# Patient Record
Sex: Female | Born: 1975 | Hispanic: Yes | Marital: Single | Smoking: Never smoker
Health system: Southern US, Community
[De-identification: ages and names within clinical notes are randomized; demographics above are authoritative.]

## PROBLEM LIST (undated history)

## (undated) DIAGNOSIS — M542 Cervicalgia: Secondary | ICD-10-CM

## (undated) DIAGNOSIS — E785 Hyperlipidemia, unspecified: Secondary | ICD-10-CM

## (undated) DIAGNOSIS — F329 Major depressive disorder, single episode, unspecified: Secondary | ICD-10-CM

## (undated) DIAGNOSIS — F32A Depression, unspecified: Secondary | ICD-10-CM

## (undated) DIAGNOSIS — R87619 Unspecified abnormal cytological findings in specimens from cervix uteri: Secondary | ICD-10-CM

## (undated) HISTORY — DX: Major depressive disorder, single episode, unspecified: F32.9

## (undated) HISTORY — DX: Unspecified abnormal cytological findings in specimens from cervix uteri: R87.619

## (undated) HISTORY — PX: COLPOSCOPY: SHX161

## (undated) HISTORY — DX: Cervicalgia: M54.2

## (undated) HISTORY — PX: TUBAL LIGATION: SHX77

## (undated) HISTORY — DX: Depression, unspecified: F32.A

## (undated) HISTORY — DX: Hyperlipidemia, unspecified: E78.5

---

## 2014-07-06 DIAGNOSIS — M542 Cervicalgia: Secondary | ICD-10-CM

## 2014-07-06 HISTORY — DX: Cervicalgia: M54.2

## 2014-09-30 ENCOUNTER — Emergency Department (HOSPITAL_COMMUNITY): Payer: Self-pay

## 2014-09-30 ENCOUNTER — Encounter (HOSPITAL_COMMUNITY): Payer: Self-pay | Admitting: Emergency Medicine

## 2014-09-30 ENCOUNTER — Emergency Department (HOSPITAL_COMMUNITY)
Admission: EM | Admit: 2014-09-30 | Discharge: 2014-09-30 | Disposition: A | Discharge status: Home / Self Care | Payer: Self-pay | Attending: Emergency Medicine | Admitting: Emergency Medicine

## 2014-09-30 DIAGNOSIS — M5412 Radiculopathy, cervical region: Secondary | ICD-10-CM | POA: Insufficient documentation

## 2014-09-30 DIAGNOSIS — M542 Cervicalgia: Secondary | ICD-10-CM

## 2014-09-30 MED ORDER — CYCLOBENZAPRINE HCL 10 MG PO TABS: 10.0000 mg | ORAL_TABLET | Freq: Three times a day (TID) | ORAL | Status: DC | PRN

## 2014-09-30 MED ORDER — KETOROLAC TROMETHAMINE 60 MG/2ML IM SOLN
60.0000 mg | Freq: Once | INTRAMUSCULAR | Status: AC
  Administered 2014-09-30: 60 mg via INTRAMUSCULAR
  Filled 2014-09-30: qty 2

## 2014-09-30 MED ORDER — NAPROXEN 500 MG PO TABS: 500.0000 mg | ORAL_TABLET | Freq: Two times a day (BID) | ORAL | Status: DC

## 2014-09-30 MED ORDER — DIAZEPAM 5 MG PO TABS
5.0000 mg | ORAL_TABLET | Freq: Once | ORAL | Status: AC
  Administered 2014-09-30: 5 mg via ORAL
  Filled 2014-09-30: qty 1

## 2014-09-30 NOTE — ED Notes (Signed)
Pt c/o rt shoulder pain x 15 days.  No injury.

## 2014-09-30 NOTE — ED Provider Notes (Signed)
CSN: [id number]     Arrival date & time 09/30/14  1637 History  This chart was scribed for a non-physician practitioner, Carman Ching, PA-C working with Debby Freiberg, MD by Martinique Peace, ED Scribe. The patient was seen in WTR5/WTR5. The patient's care was started at 5:44 PM.    Chief Complaint  Patient presents with  . Arm Pain      Patient is a 39 y.o. female presenting with arm pain. The history is provided by the patient. No language interpreter was used.  Arm Pain  HPI Comments: Christine Tapia is a 39 y.o. female who presents to the Emergency Department complaining of radiating right shoulder pain x 15 days that extends up into her neck and down her arm. Pain exacerbated with movement and applied pressure to affected area. She denies any recent traumas or mechanisms of injury. Reports intermittent tingling into her right hand. Denies numbness or weakness. Denies CP or SOB. Pt is non-smoker.    History reviewed. No pertinent past medical history. History reviewed. No pertinent past surgical history. History reviewed. No pertinent family history. History  Substance Use Topics  . Smoking status: Never Smoker   . Smokeless tobacco: Not on file  . Alcohol Use: No   OB History    No data available     Review of Systems  Musculoskeletal: Positive for neck pain.       Right shoulder pain.  All other systems reviewed and are negative.     Allergies  Review of patient's allergies indicates no known allergies.  Home Medications   Prior to Admission medications   Medication Sig Start Date End Date Taking? Authorizing Provider  cyclobenzaprine (FLEXERIL) 10 MG tablet Take 1 tablet (10 mg total) by mouth every 8 (eight) hours as needed for muscle spasms. 09/30/14   Quintasia Theroux M Jamel Dunton, PA-C  naproxen (NAPROSYN) 500 MG tablet Take 1 tablet (500 mg total) by mouth 2 (two) times daily. 09/30/14   Ajanay Farve M Mayuri Staples, PA-C  Naproxen Sodium (FLANAX PAIN RELIEF PO) Take 1 tablet by mouth once.    Yes Historical Provider, MD   BP 116/65 mmHg  Pulse 71  Temp(Src) 98.7 F (37.1 C) (Oral)  Resp 20  SpO2 100% Physical Exam  Constitutional: She is oriented to person, place, and time. She appears well-developed and well-nourished. No distress.  HENT:  Head: Normocephalic and atraumatic.  Mouth/Throat: Oropharynx is clear and moist.  Eyes: Conjunctivae are normal.  Neck: Normal range of motion. Neck supple. No spinous process tenderness and no muscular tenderness present.  Cardiovascular: Normal rate, regular rhythm, normal heart sounds and intact distal pulses.   Pulmonary/Chest: Effort normal and breath sounds normal. No respiratory distress.  Musculoskeletal: She exhibits no edema.  TTP right cervical paraspinal muscles and trapezius into right shoulder girdle. No spinous process tenderness. No bony shoulder tenderness. No edema or step-off. Full cervical range of motion, pain with lateral rotation. Full right shoulder range of motion, pain noted in all directions.  Neurological: She is alert and oriented to person, place, and time. She has normal strength.  Strength upper extremities 5/5 and equal bilateral. Sensation intact. Normal gait.  Skin: Skin is warm and dry. No rash noted. She is not diaphoretic.  Psychiatric: She has a normal mood and affect. Her behavior is normal.  Nursing note and vitals reviewed.   ED Course  Procedures (including critical care time) Labs Review Labs Reviewed - No data to display  Imaging Review Dg Cervical Spine  Complete  09/30/2014   CLINICAL DATA:  Neck and shoulder pain for approximately 2 weeks.  EXAM: CERVICAL SPINE  4+ VIEWS  COMPARISON:  None.  FINDINGS: Normal alignment of the cervical vertebral bodies. Disc spaces and vertebral bodies are maintained. No acute bony findings. Small osteophyte noted at C5. The facets are normally aligned. The neural foramen are patent. The C1-2 articulations are maintained. The lung apices are clear.   IMPRESSION: Normal alignment and no acute bony findings or significant degenerative changes.   Electronically Signed   By: Marijo Sanes M.D.   On: 09/30/2014 18:30   Dg Shoulder Right  09/30/2014   CLINICAL DATA:  Right shoulder pain for 15 days, no known injury, initial encounter  EXAM: RIGHT SHOULDER - 2+ VIEW  COMPARISON:  None.  FINDINGS: There is no evidence of fracture or dislocation. There is no evidence of arthropathy or other focal bone abnormality. Soft tissues are unremarkable.  IMPRESSION: No acute abnormality noted.   Electronically Signed   By: Inez Catalina M.D.   On: 09/30/2014 18:24     EKG Interpretation None     Medications  ketorolac (TORADOL) injection 60 mg (60 mg Intramuscular Given 09/30/14 1835)  diazepam (VALIUM) tablet 5 mg (5 mg Oral Given 09/30/14 1836)    5:46 PM- Treatment plan was discussed with patient who verbalizes understanding and agrees.    MDM   Final diagnoses:  Neck pain  Cervical radiculopathy   NAD. VSS. Afebrile. Neurovascularly intact. No signs or symptoms of central cord compression or count of quinine. X-rays negative. Reassurance given. Rx naproxen and Flexeril. Advised rest and heat. Follow-up with the wellness clinic to establish care with PCP. Stable for discharge. Return precautions given. Patient states understanding of treatment care plan and is agreeable.  I personally performed the services described in this documentation, which was scribed in my presence. The recorded information has been reviewed and is accurate.   Carman Ching, PA-C 09/30/14 1843  Debby Freiberg, MD 10/01/14 [phone]

## 2014-09-30 NOTE — Discharge Instructions (Signed)
No driving or operating heavy machinery while taking flexeril. This medication may make you drowsy. Take naproxen as prescribed. Avoid heavy lifting or hard physical activity. Follow up at the wellness clinic to establish care with a primary care physician.  Radiculopata cervical (Cervical Radiculopathy)  La radiculopata cervical se produce cuando un nervio del cuello se comprime o es desplazado un disco herniado o por cambios artrticos en los huesos de la columna cervical. Esto puede ocurrir debido a una lesin o como parte del proceso normal de envejecimiento. La presin [employer] nervios cervicales pueden causar dolor o adormecimiento que se extiende desde el cuello hacia los brazos y los dedos.  CAUSAS  Hay numerosas causas que [employer], entre las que se incluyen:   Traumatismos.  Rigidez de los msculos del cuello por el uso excesivo.  Articulaciones que duelen y que se hinchan (artritis).  Desgaste o degeneracin de los huesos y las articulaciones de la columna (espondilosis) debido al proceso de envejecimiento.  Espolones seos que pueden desarrollarse cerca de los nervios cervicales. SNTOMAS  Los sntomas son dolor, debilidad o adormecimiento en la mano y en el brazo afectados. El dolor puede ser intenso o irritante. Los sntomas pueden empeorar al extender o torcer el cuello.  DIAGNSTICO  El mdico le preguntar acerca de sus sntomas y le har un examen fsico. Risk manager de poner a prueba su fuerza y   sus reflejos. Le indicarn radiografas, una tomografa computada y Health visitor en caso de traumatismos o si los sntomas no desaparecen despus de cierto perodo de [place]. Podrn hacerle una electromiografa (EMG) o una prueba de conduccin nerviosa para estudiar el funcionamiento de sus nervios y msculos.  TRATAMIENTO  El mdico podr recomendar algunos ejercicios para [employer] sntomas. La radiculopata puede, y con frecuencia se logra, mejorar con el  tiempo y un tratamiento. Si los sntomas continan, las opciones de tratamiento son:  Usar un collar blando durante un tiempo breve.  Fisioterapia para fortalecer los msculos del cuello.  Medicamentos, como los antinflamatorios no esteroideos (AINES), corticoides por va oral o inyecciones en la columna vertebral.  Ciruga. Segn la causa del problema podrn implementarse diferentes tipos de [place]. INSTRUCCIONES PARA EL CUIDADO DOMICILIARIO  Aplique hielo sobre la zona afectada.  Ponga el hielo en una bolsa plstica.  Colquese una toalla entre la piel y la bolsa de hielo.  Deje el hielo durante 15 a 20 minutos 3 a 4 veces por da, o [place] el hielo no ayuda, puede Systems analyst. Tome una ducha o bao caliente, o use una bolsa de agua caliente segn las indicaciones de su mdico.  Puede intentar con un masaje suave en el cuello y los hombros.  Por la noche duerma con una almohada plana.  Utilice los medicamentos de venta libre o de prescripcin para Conservation officer, historic buildings, Health and safety inspector o la [place], segn se lo indique el profesional que lo asiste.  Si le indican fisioterapia, siga las indicaciones de su mdico.  Si le indican un collar blando, selo segn las indicaciones. SOLICITE ATENCIN MDICA DE INMEDIATO SI:  El dolor empeora mucho y no puede controlarlo con medicamentos.  Siente debilidad o adormecimiento en la mano, el brazo, el rostro o la pierna.  Le sube la fiebre o tiene el cuello rgido.  Pierde el control del intestino o de la vejiga (incontinencia).  Tiene dificultad para caminar, para mantener el equilibrio o para hablar. EST SEGURO QUE:   Comprende estas  instrucciones.  Controlar su enfermedad.  Solicitar ayuda de inmediato si no mejora o si empeora. Document Released: 04/01/2005 Document Revised: 09/14/2011 [facility] Patient Information 2015 [place]. This information is not intended to replace advice given to you by  your health care provider. Make sure you discuss any questions you have with your health care provider.

## 2015-02-04 DIAGNOSIS — E785 Hyperlipidemia, unspecified: Secondary | ICD-10-CM

## 2015-02-04 HISTORY — DX: Hyperlipidemia, unspecified: E78.5

## 2015-02-05 ENCOUNTER — Other Ambulatory Visit (HOSPITAL_COMMUNITY): Payer: Self-pay | Admitting: Internal Medicine

## 2015-02-05 DIAGNOSIS — Z1231 Encounter for screening mammogram for malignant neoplasm of breast: Secondary | ICD-10-CM

## 2015-02-13 ENCOUNTER — Ambulatory Visit (HOSPITAL_COMMUNITY)
Admission: RE | Admit: 2015-02-13 | Discharge: 2015-02-13 | Disposition: A | Discharge status: Home / Self Care | Payer: Self-pay | Source: Ambulatory Visit | Attending: Internal Medicine | Admitting: Internal Medicine

## 2015-02-13 DIAGNOSIS — Z1231 Encounter for screening mammogram for malignant neoplasm of breast: Secondary | ICD-10-CM

## 2015-02-14 ENCOUNTER — Other Ambulatory Visit: Payer: Self-pay | Admitting: Internal Medicine

## 2015-02-14 DIAGNOSIS — R928 Other abnormal and inconclusive findings on diagnostic imaging of breast: Secondary | ICD-10-CM

## 2015-02-15 ENCOUNTER — Encounter (HOSPITAL_COMMUNITY): Payer: Self-pay | Admitting: *Deleted

## 2015-02-21 ENCOUNTER — Encounter: Payer: Self-pay | Admitting: *Deleted

## 2015-03-05 ENCOUNTER — Ambulatory Visit
Admission: RE | Admit: 2015-03-05 | Discharge: 2015-03-05 | Disposition: A | Discharge status: Home / Self Care | Payer: No Typology Code available for payment source | Source: Ambulatory Visit | Attending: Internal Medicine | Admitting: Internal Medicine

## 2015-03-05 DIAGNOSIS — R928 Other abnormal and inconclusive findings on diagnostic imaging of breast: Secondary | ICD-10-CM

## 2015-03-05 DIAGNOSIS — N6310 Unspecified lump in the right breast, unspecified quadrant: Secondary | ICD-10-CM

## 2015-03-07 ENCOUNTER — Ambulatory Visit (HOSPITAL_COMMUNITY)
Admission: RE | Admit: 2015-03-07 | Discharge: 2015-03-07 | Disposition: A | Discharge status: Home / Self Care | Payer: No Typology Code available for payment source | Source: Ambulatory Visit | Attending: Obstetrics and Gynecology | Admitting: Obstetrics and Gynecology

## 2015-03-07 ENCOUNTER — Encounter (HOSPITAL_COMMUNITY): Payer: Self-pay

## 2015-03-07 VITALS — BP 102/64 | Temp 98.3°F | Ht 60.0 in | Wt 128.8 lb

## 2015-03-07 DIAGNOSIS — R87619 Unspecified abnormal cytological findings in specimens from cervix uteri: Secondary | ICD-10-CM

## 2015-03-07 DIAGNOSIS — R928 Other abnormal and inconclusive findings on diagnostic imaging of breast: Secondary | ICD-10-CM

## 2015-03-07 HISTORY — DX: Unspecified abnormal cytological findings in specimens from cervix uteri: R87.619

## 2015-03-07 NOTE — Progress Notes (Signed)
CLINIC:   Breast [facility] Passenger transport manager) Clinic  REASON FOR VISIT: Well-woman exam  HISTORY OF PRESENT ILLNESS:   Ms. Lasky is a 39 y.o. female who presents to the [facility] today for clinical breast exam and routine gynecological exam. She is accompanied by a translator, Lavon Paganini. Her last mammogram was on 02/13/2015 and required additional imaging. This was performed on 02/3015 and was benign. A 6 month follow-up was recommended. 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    REVIEW OF SYSTEMS:   Denies breast pain, nodularity, skin changes, nipple inversion, or nipple discharge bilaterally.  Denies any pelvic pain, pressure, or abnormal vaginal bleeding.   ALLERGIES: No Known Allergies  MEDICATIONS:  Current outpatient prescriptions:  .  cyclobenzaprine (FLEXERIL) 10 MG tablet, Take 1 tablet (10 mg total) by mouth every 8 (eight) hours as needed for muscle spasms. (Patient not taking: Reported on 03/07/2015), Disp: 10 tablet, Rfl: 0 .  naproxen (NAPROSYN) 500 MG tablet, Take 1 tablet (500 mg total) by mouth 2 (two) times daily. (Patient not taking: Reported on 03/07/2015), Disp: 15 tablet, Rfl: 0 .  Naproxen Sodium (FLANAX PAIN RELIEF PO), Take 1 tablet by mouth once., Disp: , Rfl:   PHYSICAL EXAM:   BP 102/64 mmHg  Temp(Src) 98.3 F (36.8 C) (Oral)  Ht 5' (1.524 m)  Wt 128 lb 12.8 oz (58.423 kg)  BMI 25.15 kg/m2  LMP 02/23/2015 (Exact Date)  General: Well-nourished, well-appearing female in no acute distress.  She is accompanied by a translator in clinic today.  Rolena Infante, LPN was present during physical exam for this patient.   Breasts: Bilateral breasts exposed and observed with patient standing (arms at side, arms on hips, arms on hips flexed forward, and arms over head).  No gross abnormalities including breast skin puckering or dimpling noted on observation.  Breasts symmetrical without evidence of skin redness,  thickening, or peau d'orange appearance. No nipple retraction or nipple discharge noted bilaterally.  No breast nodularity palpated in bilateral breasts.. Axillary lymph nodes: No axillary lymphadenopathy bilaterally.      ASSESSMENT & PLAN:  1. Breast cancer screening: Ms. Dorian has no palpable breast abnormalities on her clinical breast exam today.  She will receive her  6 month diagnostic mammogram as scheduled.  She will be contacted by the imaging center for results of the mammogram. She was given instructions and educational materials regarding breast self-awareness. Ms. Windish is aware of this plan and agrees with it.  2. Cervical cancer screening: Ms. Eutsler had a recent abnormal pap. She will have a colposcopy at the [facility] as scheduled.    Ms. Battle was encouraged to ask questions and all questions were answered to her satisfaction.      Mikey Bussing, DNP, AGPCNP-BC, [place] [phone]

## 2015-03-07 NOTE — Progress Notes (Signed)
Patient ID: Christine Tapia, female   DOB: Oct 15, 1975, 38 y.o.   MRN: [id number] Used interpreter Lavon Paganini

## 2015-03-15 ENCOUNTER — Encounter: Payer: Self-pay | Admitting: Obstetrics & Gynecology

## 2015-03-15 ENCOUNTER — Other Ambulatory Visit (HOSPITAL_COMMUNITY)
Admission: RE | Admit: 2015-03-15 | Discharge: 2015-03-15 | Disposition: A | Discharge status: Home / Self Care | Payer: No Typology Code available for payment source | Source: Ambulatory Visit | Attending: Obstetrics & Gynecology | Admitting: Obstetrics & Gynecology

## 2015-03-15 ENCOUNTER — Ambulatory Visit (INDEPENDENT_AMBULATORY_CARE_PROVIDER_SITE_OTHER): Payer: No Typology Code available for payment source | Admitting: Obstetrics & Gynecology

## 2015-03-15 VITALS — BP 100/50 | HR 55 | Temp 98.0°F | Ht 60.0 in | Wt 130.0 lb

## 2015-03-15 DIAGNOSIS — R87810 Cervical high risk human papillomavirus (HPV) DNA test positive: Secondary | ICD-10-CM

## 2015-03-15 DIAGNOSIS — R87610 Atypical squamous cells of undetermined significance on cytologic smear of cervix (ASC-US): Secondary | ICD-10-CM

## 2015-03-15 DIAGNOSIS — N871 Moderate cervical dysplasia: Secondary | ICD-10-CM | POA: Insufficient documentation

## 2015-03-15 DIAGNOSIS — Z3202 Encounter for pregnancy test, result negative: Secondary | ICD-10-CM

## 2015-03-15 LAB — POCT PREGNANCY, URINE: Preg Test, Ur: NEGATIVE

## 2015-03-15 NOTE — Progress Notes (Signed)
Spanish interpreter Anastasio Auerbach

## 2015-03-15 NOTE — Progress Notes (Signed)
Patient ID: Christine Tapia, female   DOB: 17-Jan-1976, 39 y.o.   MRN: [id number] Patient given informed consent, signed copy in the chart, time out was performed.  Placed in lithotomy position. Cervix viewed with speculum and colposcope after application of acetic acid.   Colposcopy adequate?  yes Acetowhite lesions?yes Punctation?no Mosaicism?  no Abnormal vasculature?  no Biopsies?yes ECC?yes  Patient was given post procedure instructions.  We will contact her as soon as her results come in.  I suspect that she will be able to follow up in 1 year for cotesting  Dyshaun Bonzo L. Harraway-Smith, M.D., Cherlynn June

## 2015-03-15 NOTE — Patient Instructions (Signed)
Colposcopa (Colposcopy) La colposcopa es un procedimiento para examinar el cuello del tero y la vagina o la zona externa alrededor de la vagina, para buscar signos de enfermedad o anormalidades. Para realizar este procedimiento se utiliza un microscopio con luz, llamado colposcopio. Durante el procedimiento podrn tomarle muestras de tejido, en caso que el profesional encuentre clulas anormales. La colposcopa se indica si la mujer tiene:  Papanicolau anormal. El Papanicolaou es un examen mdico que se realiza para evaluar las clulas que estn en la superficie del cuello uterino.  Un resultado de Pap que podra indicar la presencia del virus del papiloma humano (VPH). El virus puede producir verrugas genitales y est relacionado con el desarrollo de cncer cervical.  Una lcera en el cuello del tero y el resultado del Pap fue normal.  Se han observado verrugas genitales en el cuello del tero o en la zona externa de la vagina.  Una madre que ha consumido dietilstilbestrol (DES) durante el embarazo.  Relaciones sexuales dolorosas.  Hemorragias vaginales, especialmente despus de mantener relaciones sexuales. INFORME A SU MDICO:  Cualquier alergia que tenga.  Todos los medicamentos que utiliza, incluyendo vitaminas, hierbas, gotas oftlmicas, cremas y medicamentos de venta libre.  Problemas previos que usted o los miembros de su familia hayan tenido con el uso de anestsicos.  Enfermedades de la sangre.  Cirugas previas.  Padecimientos mdicos. RIESGOS Y COMPLICACIONES En general, la colposcopa es un procedimiento seguro. Sin embargo, como en cualquier procedimiento, pueden surgir complicaciones. Las complicaciones posibles son:  Hemorragias.  Infeccin.  Lesiones que no se detectan. ANTES DEL PROCEDIMIENTO   Informe al mdico si tiene el perodo menstrual. En general, la colposcopa no se realiza durante la menstruacin.  Durante las 24 horas previas a la colposcopa  no debe:  Realizar duchas vaginales.  Usar tampones.  Aplicarse medicamentos, cremas o supositorios en la vagina.  Tener relaciones sexuales. PROCEDIMIENTO  Durante el procedimiento, estar acostada sobre la espalda con los pies en los soportes (estribos). Le colocarn el la vagina un instrumento metlico o plstico entibiado espculo) para mantenerla abierta y permitir al profesional visualizar el cuello del tero. El colposcopio se coloca fuera de la vagina. Este instrumento se utiliza para ampliar y examinar el cuello del tero, la vagina y la zona externa de la misma. Se aplica una pequea cantidad de solucin lquida en la zona a observar. Este lquido facilita la observacin de clulas anormales. El mdico utilizar instrumentos para aspirar la mucosidad y las clulas del canal del cuello del tero. Luego registrar la ubicacin de las reas anormales. Si le hacen una biopsia durante el procedimiento, le aplicarn un medicamento para adormecer la zona (anestsico local). Podr sentir un dolor o clicos leves mientras le hacen la biopsia. Despus del procedimiento, las muestras de tejido recolectadas durante la biopsia se enviarn al laboratorio para ser analizadas. DESPUS DEL PROCEDIMIENTO  Le darn instrucciones para que concurra al control con su mdico para recibir los resultados de los estudios. Es importante que cumpla con todas las visitas. Document Released: 06/22/2005 Document Revised: 02/22/2013 Exit[employer] Patient Information 2015 [employer]. This information is not intended to replace advice given to you by your health care provider. Make sure you discuss any questions you have with your health care provider.  

## 2015-03-21 ENCOUNTER — Encounter (HOSPITAL_COMMUNITY): Payer: Self-pay

## 2015-03-21 ENCOUNTER — Inpatient Hospital Stay (HOSPITAL_COMMUNITY): Payer: No Typology Code available for payment source

## 2015-03-21 ENCOUNTER — Inpatient Hospital Stay (HOSPITAL_COMMUNITY)
Admission: AD | Admit: 2015-03-21 | Discharge: 2015-03-21 | Disposition: A | Discharge status: Home / Self Care | Payer: Self-pay | Source: Ambulatory Visit | Attending: Emergency Medicine | Admitting: Emergency Medicine

## 2015-03-21 DIAGNOSIS — M546 Pain in thoracic spine: Secondary | ICD-10-CM

## 2015-03-21 DIAGNOSIS — R079 Chest pain, unspecified: Secondary | ICD-10-CM | POA: Insufficient documentation

## 2015-03-21 DIAGNOSIS — M25511 Pain in right shoulder: Secondary | ICD-10-CM | POA: Insufficient documentation

## 2015-03-21 LAB — CBC WITH DIFFERENTIAL/PLATELET
Basophils Absolute: 0 10*3/uL (ref 0.0–0.1)
Basophils Relative: 0 %
Eosinophils Absolute: 0.2 10*3/uL (ref 0.0–0.7)
Eosinophils Relative: 2 %
HCT: 32.8 % — ABNORMAL LOW (ref 36.0–46.0)
Hemoglobin: 10.8 g/dL — ABNORMAL LOW (ref 12.0–15.0)
Lymphocytes Relative: 17 %
Lymphs Abs: 1.8 10*3/uL (ref 0.7–4.0)
MCH: 28.2 pg (ref 26.0–34.0)
MCHC: 32.9 g/dL (ref 30.0–36.0)
MCV: 85.6 fL (ref 78.0–100.0)
Monocytes Absolute: 0.7 10*3/uL (ref 0.1–1.0)
Monocytes Relative: 6 %
Neutro Abs: 7.6 10*3/uL (ref 1.7–7.7)
Neutrophils Relative %: 75 %
Platelets: 365 10*3/uL (ref 150–400)
RBC: 3.83 MIL/uL — ABNORMAL LOW (ref 3.87–5.11)
RDW: 13.9 % (ref 11.5–15.5)
WBC: 10.2 10*3/uL (ref 4.0–10.5)

## 2015-03-21 LAB — BASIC METABOLIC PANEL
Anion gap: 9 (ref 5–15)
BUN: 11 mg/dL (ref 6–20)
CO2: 22 mmol/L (ref 22–32)
Calcium: 8.4 mg/dL — ABNORMAL LOW (ref 8.9–10.3)
Chloride: 108 mmol/L (ref 101–111)
Creatinine, Ser: 0.54 mg/dL (ref 0.44–1.00)
GFR calc Af Amer: >60 mL/min (ref >60)
GFR calc non Af Amer: >60 mL/min (ref >60)
Glucose, Bld: 92 mg/dL (ref 65–99)
Potassium: 3.4 mmol/L — ABNORMAL LOW (ref 3.5–5.1)
Sodium: 139 mmol/L (ref 135–145)

## 2015-03-21 LAB — D-DIMER, QUANTITATIVE: D-Dimer, Quant: <0.27 ug/mL-FEU (ref 0.00–0.48)

## 2015-03-21 LAB — I-STAT TROPONIN, ED: Troponin i, poc: 0 ng/mL (ref 0.00–0.08)

## 2015-03-21 MED ORDER — CYCLOBENZAPRINE HCL 10 MG PO TABS: 10.0000 mg | ORAL_TABLET | Freq: Three times a day (TID) | ORAL | Status: DC | PRN

## 2015-03-21 MED ORDER — FENTANYL CITRATE (PF) 100 MCG/2ML IJ SOLN
50.0000 ug | Freq: Once | INTRAMUSCULAR | Status: AC
  Administered 2015-03-21: 50 ug via INTRAVENOUS
  Filled 2015-03-21: qty 2

## 2015-03-21 MED ORDER — SODIUM CHLORIDE 0.9 % IV BOLUS (SEPSIS)
1000.0000 mL | Freq: Once | INTRAVENOUS | Status: AC
  Administered 2015-03-21: 1000 mL via INTRAVENOUS

## 2015-03-21 MED ORDER — SODIUM CHLORIDE 0.9 % IV SOLN
999.0000 mL | INTRAVENOUS | Status: DC
  Administered 2015-03-21: 999 mL via INTRAVENOUS

## 2015-03-21 MED ORDER — ASPIRIN 81 MG PO CHEW
324.0000 mg | CHEWABLE_TABLET | Freq: Once | ORAL | Status: AC
  Administered 2015-03-21: 12:00:00 via ORAL

## 2015-03-21 MED ORDER — ASPIRIN 81 MG PO CHEW
CHEWABLE_TABLET | ORAL | Status: AC
  Filled 2015-03-21: qty 3

## 2015-03-21 MED ORDER — NITROGLYCERIN 0.4 MG SL SUBL
0.4000 mg | SUBLINGUAL_TABLET | SUBLINGUAL | Status: DC | PRN
  Administered 2015-03-21 (×3): 0.4 mg via SUBLINGUAL

## 2015-03-21 NOTE — ED Notes (Signed)
Please contact if necessary Keith Rake [phone]

## 2015-03-21 NOTE — ED Notes (Signed)
Per EMS -  Pt came into women's clinic c/o chest pain. Pt diaphoretic, c/o arm tingling, hyperventilating. Denied CP on EMS arrival, just sharp pain in mid back. No medical hx. 3 nitro and 325mg  aspirin prior to arrival to ED.

## 2015-03-21 NOTE — MAU Note (Signed)
Nitro q5 x3. Pt feeling a little better. EMS at beside. Non re breather mask removed pt was hyper ventilating with it on . Breathing easier now 100% sat on R/A. Interpreter accompanied pt on transfer to [facility]

## 2015-03-21 NOTE — Care Management Note (Addendum)
Case Management Note  Patient Details  Name: Christine Tapia MRN: [id number] Date of Birth: [birth year]  Subjective/Objective:                  39 yo pt presents at [facility] clinic c/o chest pain. Pt diaphoretic, c/o arm tingling, hyperventilating. Denied CP on EMS arrival, just sharp pain in mid back.. //Home with family  Action/Plan: Follow for disposition needs.   Expected Discharge Date:          03/21/15        Expected Discharge Plan:  Home/Self Care  In-House Referral:  PCP / [facility]  Discharge [facility], CM Consult  Post Acute Care Choice:  NA Choice offered to:  NA  DME Arranged:  N/A DME Agency:  NA  HH Arranged:  NA HH Agency:  NA  Status of Service:  Completed, signed off  Medicare Important Message Given:    Date Medicare IM Given:    Medicare IM give by:    Date Additional Medicare IM Given:    Additional Medicare Important Message give by:     If discussed at [place] of Stay Meetings, dates discussed:    Additional Comments: Jonerik Sliker J. Clydene Laming, RN, BSN, [place] [phone] ED CM consulted regarding PCP establishment and insurance enrollment. Pt presented to [facility] ED today with chest pain. NCM met with pt at bedside; pt confirms not having access to f/u care with PCP or insurance coverage. Discussed with patient importance and benefits of establishing PCP, and not utilizing the ED for primary care needs. Pt verbalized understanding and is in agreement. Discussed other options, provided list of local  affordable PCPs.  Pt voiced interest in the [facility] and [place].  NCM advised that [facility]  Internal Medicine providers are seeing pts at [facility]. Pt verbalized understanding. NCM set up appointment on 04/11/15 at 1045.  Fuller Mandril, RN 03/21/2015, 3:35 PM

## 2015-03-21 NOTE — MAU Provider Note (Cosign Needed)
History     CSN: [id number]  Arrival date and time: 03/21/15 1150   None     No chief complaint on file.  HPI   Spanish interpretor used:   Christine Tapia is a 39 y.o. female [phone] presenting to MAU with right shoulder pain/chest pain that started suddenly this morning.   The patient was slumped in the whealchair, diaphoretic stating that both arms feel numb when she was brought up by clinic staff.   The initial history was taken by a friend whom she works with, she told her friend that she was having severe pain in her right that radiated to her back; she stated this when she arrived to work. Her friend put her in the car and brought her to the [place]. In the car the patient was talking and when they pulled up to the hospital the patient started shaking saying that her arms were numb.   Her right shoulder pain started at 0900; the pain radiates to her back- she does not have a history of chest pain or myocardial infarction. She has never had this pain before.  Currently the pain is in her back and the chest pain is not as bad.  The pain in now (1200) is in her lungs and is described as stabbing, constant and hurts worse when she tries to roll over or lay down.   The patient is having numbness and weakness in both arms currently.    OB History    Gravida Para Term Preterm AB TAB SAB Ectopic Multiple Living   5 5 5       4       No past medical history on file.  Past Surgical History  Procedure Laterality Date  . Tubal ligation      Family History  Problem Relation Age of Onset  . Cancer Mother   . Hypertension Mother     Social History  Substance Use Topics  . Smoking status: Never Smoker   . Smokeless tobacco: Not on file  . Alcohol Use: No    Allergies: No Known Allergies  Prescriptions prior to admission  Medication Sig Dispense Refill Last Dose  . cyclobenzaprine (FLEXERIL) 10 MG tablet Take 1 tablet (10 mg total) by mouth every 8 (eight) hours  as needed for muscle spasms. (Patient not taking: Reported on 03/07/2015) 10 tablet 0 Not Taking  . naproxen (NAPROSYN) 500 MG tablet Take 1 tablet (500 mg total) by mouth 2 (two) times daily. (Patient not taking: Reported on 03/07/2015) 15 tablet 0 Not Taking  . Naproxen Sodium (FLANAX PAIN RELIEF PO) Take 1 tablet by mouth once.   Not Taking   No results found for this or any previous visit (from the past 48 hour(s)).  Review of Systems  Constitutional: Positive for diaphoresis. Negative for fever and chills.  Respiratory: Positive for shortness of breath. Negative for wheezing.   Cardiovascular: Positive for chest pain.  Gastrointestinal: Negative for heartburn.  Neurological: Positive for dizziness, tremors and weakness. Negative for seizures and headaches.   Physical Exam   Blood pressure 109/57, pulse 80, resp. rate 24, last menstrual period 02/23/2015, SpO2 100 %.  Physical Exam  HENT:  Head: Normocephalic.  Cardiovascular: Normal rate and normal heart sounds.   Unable to complete ECG due to patients trembling   Respiratory: Tachypnea noted. No respiratory distress.  Musculoskeletal: Normal range of motion.  Neurological: She is alert. She displays tremor. She exhibits abnormal muscle tone (Unable to raise bilateral  upper extremities ). She displays no seizure activity.  Skin: Skin is warm.    MAU Course  Procedures  None  MDM  Chest pain protocol ordered  109/71 Pulse ox 100% Pulse 93  10/10 pain when she moves.  Spanish interpretor remains at the bedside.   Assessment and Plan   A:  1. Chest pain, unspecified chest pain type    P:  Transfer to [facility] for further evaluation   Lezlie Lye, NP 03/21/2015 1:24 PM

## 2015-03-21 NOTE — MAU Note (Signed)
Pt was brought to MAU from clinic with c/o chest pain and arm numbness and SOB.  Pt is diaphoretic  And hyperventilating. Brought back to room. Provider at bedside.  EKG , IV ordered. See v/s.flow sheet.

## 2015-03-21 NOTE — ED Provider Notes (Signed)
CSN: [id number]     Arrival date & time 03/21/15  1150 History   First MD Initiated Contact with Patient 03/21/15 1235     Chief Complaint  Patient presents with  . Chest Pain     (Consider location/radiation/quality/duration/timing/severity/associated sxs/prior Treatment) HPI   Pt presents with left upper back pain and SOB that began yesterday around 3:00 while she was at work [employer]).  States the pain is constant in her left back, worsening, exacerbated by movement, palpation, and deep inspiration.  The pain was mild when it began and she took two ibuprofen with complete relief.  The pain began again last night and worsened overnight.  She took two additional ibuprofen this morning without relief.  Was seen at [facility] who sent her to [facility] ED out of concern for chest pain.  Pt denies chest pain at any time.  Denies fever/chills, myalgias, abdominal pain, cough, recent URI symptoms.  Denies leg swelling, recent immobilization. Mother had blood clot but in the setting of advanced age and cancer.  Denies any injury, heavy lifting, falls, trauma.  She is not on exogenous estrogen.  History reviewed. No pertinent past medical history. Past Surgical History  Procedure Laterality Date  . Tubal ligation     Family History  Problem Relation Age of Onset  . Cancer Mother   . Hypertension Mother    Social History  Substance Use Topics  . Smoking status: Never Smoker   . Smokeless tobacco: None  . Alcohol Use: No   OB History    Gravida Para Term Preterm AB TAB SAB Ectopic Multiple Living   5 5 5       4      Review of Systems  All other systems reviewed and are negative.     Allergies  Review of patient's allergies indicates no known allergies.  Home Medications   Prior to Admission medications   Medication Sig Start Date End Date Taking? Authorizing Provider  cyclobenzaprine (FLEXERIL) 10 MG tablet Take 1 tablet (10 mg total) by mouth every 8 (eight) hours as needed  for muscle spasms. Patient not taking: Reported on 03/07/2015 09/30/14   Carman Ching, PA-C  naproxen (NAPROSYN) 500 MG tablet Take 1 tablet (500 mg total) by mouth 2 (two) times daily. Patient not taking: Reported on 03/07/2015 09/30/14   Carman Ching, PA-C  Naproxen Sodium (FLANAX PAIN RELIEF PO) Take 1 tablet by mouth once.    Historical Provider, MD   BP 109/57 mmHg  Pulse 80  Resp 24  SpO2 100%  LMP 02/23/2015 (Exact Date) Physical Exam  Constitutional: She appears well-developed and well-nourished. No distress.  Uncomfortable appearing  HENT:  Head: Normocephalic and atraumatic.  Neck: Neck supple.  Cardiovascular: Normal rate, regular rhythm and intact distal pulses.   Pulmonary/Chest: Effort normal and breath sounds normal. No respiratory distress. She has no decreased breath sounds. She has no wheezes. She has no rhonchi. She has no rales.    Abdominal: Soft. She exhibits no distension. There is no tenderness. There is no rebound and no guarding.  Musculoskeletal: She exhibits no edema.  Neurological: She is alert. She exhibits normal muscle tone.  Moves all extremities equally   Skin: She is not diaphoretic.  Nursing note and vitals reviewed.   ED Course  Procedures (including critical care time) Labs Review Labs Reviewed  BASIC METABOLIC PANEL - Abnormal; Notable for the following:    Potassium 3.4 (*)    Calcium 8.4 (*)  All other components within normal limits  CBC WITH DIFFERENTIAL/PLATELET - Abnormal; Notable for the following:    RBC 3.83 (*)    Hemoglobin 10.8 (*)    HCT 32.8 (*)    All other components within normal limits  D-DIMER, QUANTITATIVE (NOT AT [facility])  Randolm Idol, ED    Imaging Review Dg Chest 2 View  03/21/2015   CLINICAL DATA:  Shortness of breath with left upper posterior chest region pain  EXAM: CHEST  2 VIEW  COMPARISON:  None.  FINDINGS: There is no edema or consolidation. Heart size and pulmonary vascularity are normal. No  pneumothorax. No adenopathy. No bone lesions.  IMPRESSION: No abnormality noted.   Electronically Signed   By: Lowella Grip III M.D.   On: 03/21/2015 13:31   I have personally reviewed and evaluated these images and lab results as part of my medical decision-making.   EKG Interpretation None       ED ECG REPORT   Date: 03/21/2015  Rate: 70  Rhythm: normal sinus rhythm  QRS Axis: normal  Intervals: normal  ST/T Wave abnormalities: normal  Conduction Disutrbances:none  Narrative Interpretation:   Old EKG Reviewed: none available  I have personally reviewed the EKG tracing and agree with the computerized printout as noted.   2:17 PM Pt reports she is feeling much better after pain medication, breathing is improved.    MDM   Final diagnoses:  Chest pain, unspecified chest pain type    Afebrile, nontoxic patient with left upper back pain that is reproducible with palpation, hurts with movement of her left arm and with moving her torso.  CXR, EKG, labs including troponin, d-dimer negative.  Pt also seen and examined by Dr Mingo Amber who recommends d/c home with flexeril.  D/C home with flexeril, PCP appointment made by case manager Rosendo Gros  for follow up.  Discussed result, findings, treatment, and follow up  with patient.  Pt given return precautions.  Pt verbalizes understanding and agrees with plan.         Clayton Bibles, PA-C 03/21/15 [place], MD 03/22/15 [phone]

## 2015-03-21 NOTE — Discharge Instructions (Signed)
Read the information below.  Use the prescribed medication as directed.  Please discuss all new medications with your pharmacist.  You may return to the Emergency Department at any time for worsening condition or any new symptoms that concern you.    If you develop fevers, loss of control of bowel or bladder, weakness or numbness in your legs, or are unable to walk, return to the ER for a recheck.   Lea la siguiente informacin . Usar el medicamento recetado como se indica. Por favor discutir todos los medicamentos nuevos con su farmacutico . Usted puede volver a la sala de urgencias en cualquier momento por cualquier condicin o nuevos sntomas que le preocupan empeoramiento . Si se presenta fiebre , prdida de control del intestino o la vejiga , debilidad o entumecimiento en las piernas , o es incapaz de Writer , Location manager a la sala de Multimedia programmer de volver a Physicist, medical de espalda en el adulto (Back Pain, Adult)  El dolor de cintura es frecuente. Aproximadamente 1 de cada 5 personas lo sufren.La causa rara vez pone en peligro la vida. Con frecuencia mejora luego de algn tiempo.Alrededor de la mitad de las personas que sufren un inicio sbito de dolor de cintura, se sentirn mejor luego de 2 semanas. Aproximadamente 8 de cada 10 se sentirn mejor luego de 6 semanas.  CAUSAS  Algunas causas comunes son:   Distensin de los msculos o ligamentos que sostienen la columna vertebral.  Desgaste (degeneracin) de los discos vertebrales.  Artritis.  Traumatismos directos en la espalda. DIAGNSTICO  La mayor parte de las veces, la causa directa no se conoce.Sin embargo, Conservation officer, historic buildings puede tratarse efectivamente an cuando no se Community education officer.Una de las formas ms precisas de asegurar que la causa del dolor no constituye un peligro es responder a las preguntas del mdico acerca de su salud y sus sntomas. Si el mdico necesita ms informacin, podr indicar anlisis de laboratorio o Optometrist un  diagnstico por imgenes (radiografas o Health visitor).Sin embargo, aunque las [employer] modificaciones, generalmente no es necesaria la [place].  INSTRUCCIONES PARA EL CUIDADO EN EL HOGAR  En algunas personas, el dolor de espalda vuelve.Como rara vez es peligroso, los pacientes pueden aprender a Education administrator.   Mantngase activo. Si permanece sentado o de pie mucho tiempo en el mismo lugar, se tensiona la espalda.  No se siente, maneje ni se quede parado en un mismo lugar por ms de 30 minutos. Realice caminatas cortas en superficies planas ni bien el dolor haya cedido. Trate de Orthoptist tiempo que camina .  No se quede en la cama.Si hace reposo durante ms de 1 o 2 das, puede Geologist, engineering.  No evite los ejercicios ni el trabajo.El cuerpo est hecho para moverse.No es peligroso estar [place], aunque le duela la espalda.La espalda se curar ms rpido si contina sus actividades antes de que el dolor se vaya.  Preste atencin a su cuerpo cuando se incline y se levante. Muchas personas sienten menos molestias cuando levantan objetos si doblan las rodillas, mantienen la carga cerca del cuerpo y evitan torcerse. Generalmente, las posiciones ms cmodas son las que ejercen menos tensin en la espalda en recuperacin.  Encuentre una posicin cmoda para dormir. Utilice un colchn firme y recustese de [place]. Doble ligeramente sus rodillas. Si se recuesta sobre su espalda, coloque una almohada debajo de sus rodillas.  Tome slo medicamentos de venta libre o recetados, segn las indicaciones del mdico.  Los medicamentos de venta libre para Glass blower/designer y reducir Futures trader, son los que en general ms ayudan.El mdico podr prescribirle relajantes musculares.Estos medicamentos calman el dolor de modo que pueda retornar a sus actividades normales y a Marine scientist.  Aplique hielo sobre la zona lesionada.  Ponga el hielo en  una bolsa plstica.  Colquese una toalla entre la piel y la bolsa de hielo.  Deje la bolsa de hielo durante 15 a 20 minutos 3 a 4 veces por da, durante los primeros 2  3 das. Luego podr alternar Lyndal Pulley calor y 70 para reducir Conservation officer, historic buildings y los espasmos.  Consulte a su mdico si puede tratar de hacer ejercicios para la espalda y recibir un masaje suave. Pueden ser beneficiosos.  Evite sentirse ansioso o estresado.El estrs aumenta la tensin muscular y puede empeorar el dolor de espalda.Es importante reconocer cuando est ansioso o estresado y aprender la forma de controlarlos.El ejercicio es una gran opcin. SOLICITE ATENCIN MDICA SI:   Siente un dolor que no se alivia con reposo o medicamentos.  El dolor no mejora en 1 semana.  Desarrolla nuevos sntomas.  No se siente bien en general. SOLICITE ATENCIN MDICA DE INMEDIATO SI:  Siente un dolor que se irradia desde la espalda hacia sus piernas.  Desarrolla nuevos problemas en el intestino o la vejiga.  Siente debilidad o adormecimiento inusual en sus brazos o piernas.  Presenta nuseas o vmitos.  Presenta dolor abdominal.  Se siente desfalleciente. Document Released: 06/22/2005 Document Revised: 12/22/2011 [facility] Patient Information 2015 [place], [place]. This information is not intended to replace advice given to you by your health care provider. Make sure you discuss any questions you have with your health care provider.  Dolor msculoesqueltico (Musculoskeletal Pain) El dolor musculoesqueltico se siente en huesos y msculos. El dolor puede ocurrir en cualquier parte del cuerpo. El profesional que lo asiste podr tratarlo sin Pharmacist, community causa del dolor. Lo tratar [employer] de laboratorio (sangre y [place]), las radiografas y [place]. La causa de estos dolores puede ser un virus.  CAUSAS Generalmente no existe una causa definida para este trastorno. Tambin el [employer] [place] [employer] puede deberse a  la [place]. En la actividad excesiva se incluye el hacer ejercicios fsicos muy intensos cuando no se est en buena forma. El dolor de huesos tambin puede deberse a cambios climticos. Los huesos son sensibles a los cambios en la presin atmosfrica. [place]  Para proteger su privacidad, no se entregarn los [employer] pruebas por telfono. Asegrese de conseguirlos. Consulte el modo en que podr obtenerlos si no se lo han informado. Es su responsabilidad contar con los [employer].  Utilice los medicamentos de venta libre o de prescripcin para Conservation officer, historic buildings, Health and safety inspector o la [place], segn se lo indique el profesional que lo asiste. Si le han administrado medicamentos, no conduzca, no opere maquinarias ni Teacher, adult education, y tampoco firme documentos legales durante 24 horas. No beba alcohol. No tome pldoras para dormir ni otros medicamentos que Animal nutritionist.  Podr seguir con todas las actividades a menos que stas le ocasionen ms [employer]. Cuando el dolor disminuya, es importante que gradualmente reanude toda la rutina habitual. Retome las actividades comenzando lentamente. Aumente gradualmente la intensidad y la duracin de sus actividades o del ejercicio.  Durante los perodos de dolor intenso, el reposo en cama puede ser beneficioso. Recustese o sintese en la posicin que le sea  ms cmoda.  Coloque hielo sobre la zona afectada.  Ponga hielo en [place].  Colquese una toalla entre la piel y la bolsa de hielo.  Aplique el hielo durante 10 a 20 minutos 3  4 veces por da.  Si el dolor empeora, o no desaparece puede ser [employer] repetir las pruebas o Optometrist nuevos exmenes. El profesional que lo asiste podr requerir investigar ms profundamente para Animator causa posible. SOLICITE ATENCIN MDICA DE INMEDIATO SI:  Siente que el dolor empeora y no se alivia con los medicamentos.  Siente  dolor en el pecho asociado a falta de aire, sudoracin, nuseas o vmitos.  El dolor se localiza en el abdomen.  Comienza a sentir nuevos sntomas que parecen ser diferentes o que lo preocupan. ASEGRESE DE QUE:   Comprende las instrucciones para el alta mdica.  Controlar su enfermedad.  Solicitar atencin mdica de inmediato segn las indicaciones. Document Released: 04/01/2005 Document Revised: 09/14/2011 [facility] Patient Information 2015 [place]. This information is not intended to replace advice given to you by your health care provider. Make sure you discuss any questions you have with your health care provider.

## 2015-03-21 NOTE — Progress Notes (Signed)
Interpreter Lesle Chris for ED

## 2015-04-11 ENCOUNTER — Ambulatory Visit: Payer: No Typology Code available for payment source | Admitting: Family Medicine

## 2015-04-15 ENCOUNTER — Ambulatory Visit (INDEPENDENT_AMBULATORY_CARE_PROVIDER_SITE_OTHER): Payer: Self-pay | Admitting: Obstetrics & Gynecology

## 2015-04-15 ENCOUNTER — Other Ambulatory Visit (HOSPITAL_COMMUNITY)
Admission: RE | Admit: 2015-04-15 | Discharge: 2015-04-15 | Disposition: A | Discharge status: Home / Self Care | Payer: No Typology Code available for payment source | Source: Ambulatory Visit | Attending: Obstetrics & Gynecology | Admitting: Obstetrics & Gynecology

## 2015-04-15 ENCOUNTER — Encounter: Payer: Self-pay | Admitting: Obstetrics & Gynecology

## 2015-04-15 VITALS — BP 109/76 | HR 67 | Temp 98.5°F | Wt 132.0 lb

## 2015-04-15 DIAGNOSIS — N871 Moderate cervical dysplasia: Secondary | ICD-10-CM | POA: Insufficient documentation

## 2015-04-15 DIAGNOSIS — Z01812 Encounter for preprocedural laboratory examination: Secondary | ICD-10-CM

## 2015-04-15 DIAGNOSIS — Z9889 Other specified postprocedural states: Secondary | ICD-10-CM

## 2015-04-15 DIAGNOSIS — Z3202 Encounter for pregnancy test, result negative: Secondary | ICD-10-CM

## 2015-04-15 HISTORY — PX: CERVICAL BIOPSY  W/ LOOP ELECTRODE EXCISION: SUR135

## 2015-04-15 LAB — POCT PREGNANCY, URINE: Preg Test, Ur: NEGATIVE

## 2015-04-15 NOTE — Progress Notes (Signed)
Patient ID: Christine Tapia, female   DOB: 12-23-1975, 39 y.o.   MRN: [id number] [place] Spanish Interpreter Pt to view LEEP video

## 2015-04-15 NOTE — Progress Notes (Signed)
Patient identified, informed consent obtained, signed copy in chart, time out performed.  Pap smear and colposcopy reviewed.   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  Cervix visualized. Paracervical block placed.  Medium Fisher size LOOP used to remove cone of cervix using blend of cut and cautery on LEEP machine.  Edges/Base cauterized with Ball.  Monsel's solution used for hemostasis.  Patient tolerated procedure well.  Patient given post procedure instructions.  Follow up in 12 months for repeat pap or as needed.  Woodroe Mode, MD 04/15/2015

## 2015-04-15 NOTE — Patient Instructions (Addendum)
Procedimiento de escisin electroquirrgica con asa (Loop Electrosurgical Excision Procedure) El procedimiento de escisin electroquirrgica con asa es la extirpacin de una porcin de la parte inferior del tero (cuello). El procedimento se realiza cuando hay cambios significativamente anormales en las clulas del cuello del tero. Estos cambios pueden causar cncer si se dejan sin tratar.   El procedimiento mismo slo demora algunos minutos. Generalmente se Personal assistant del mdico. Se considera un procedimiento seguro para aquellas mujeres que desean o tratan de quedar embarazadas. Solo bajo raras circunstancias este procedimiento se realiza estando embarazada.  INFORME A SU MDICO:   Si est embarazada o no tuvo el ltimo perodo menstrual.  Alergias a alimentos o medicamentos.  Todos los [employer] [place], incluyendo vitaminas, hierbas, gotas oftlmicas, medicamentos de [place] y Proofreader.  Uso de corticoides (por va oral o cremas).  Problemas anteriores debido a anestsicos o a medicamentos que [employer] sensibilidad.  Cirugas ginecolgicas previas.  Antecedentes de hemorragias o cogulos sanguneos.  Infecciones recientes o actuales en la vagina (herpes, enfermedades de transmisin sexual).  Otros problemas de [place]. [place].  Infecciones.  Lesiones en la vagina, la vejiga o el recto.  Obstruccin rara en la abertura del cuello que causa problemas durante la menstruacin (estenosis cervical). ANTES DEL PROCEDIMIENTO   No tome aspirina ni anticoagulantes durante la semana previa al procedimiento, o segn le hayan indicado.  Consuma una comida ligera antes del procedimiento.  Consulte a su mdico si debe cambiar o suspender los medicamentos que toma habitualmente.  Le administrarn un analgsico 1  2 horas antes del procedimiento. PROCEDIMIENTO   Se coloca un instrumento (espculo) en la vagina. Esto le permite  al mdico observar el cuello.  Se aplica tintura de yodo para encontrar la zona de las clulas anormales.  Se inyecta un medicamento para adormecer el cuello (anestsico local).   Se pasa electricidad a travs de una delgada asa de alambre que luego se [place] para extirpar (cauterizar) un pequeo segmento de la zona afectada.  Se utiliza una ligera electrocauterizacin para sellar los vasos sanguneos pequeos y Engineer, water.  Aplicarn una pasta en la zona cauterizada para prevenir el sangrado.  La muestra de tejido se enva al laboratorio. Luego, se examina en el microscopio. DESPUS DEL PROCEDIMIENTO   Pdale a alguna persona que la lleve hasta su casa.  Puede sentir un clico moderado a suave.  Puede notar una secrecin vaginal negra por la pasta usada para prevenir el sangrado. Esto es normal.  Observe si tiene un sangrado excesivo. Esto requiere atencin mdica inmediata.  Consulte con su mdico la fecha en que los resultados estarn disponibles. Asegrese de [employer].   Esta informacin no tiene Marine scientist el consejo del mdico. Asegrese de hacerle al mdico cualquier pregunta que tenga.   Document Released: 03/04/2011 Document Revised: 09/14/2011 Elsevier Interactive Patient Education 2016 [place] de escisin electroquirrgica con asa - Cuidados posteriores  (Loop Electrosurgical Excision Procedure, Care After) Siga estas instrucciones durante las prximas semanas. Estas indicaciones le proporcionan informacin general acerca de cmo deber cuidarse despus del procedimiento. El mdico tambin podr darle instrucciones especficas. El tratamiento ha sido planificado segn las prcticas mdicas actuales, pero en algunos casos pueden ocurrir problemas. Comunquese con el mdico si tiene algn problema o tiene preguntas despus del procedimiento.  INSTRUCCIONES PARA EL CUIDADO EN EL HOGAR   No use tampones, no se d duchas  vaginales ni tenga relaciones sexuales durante  2 semanas, o segn lo que le indique su mdico.  Comience con las actividades habituales si no tiene o tiene mnimo de clicos y Teacher, music, excepto que el mdico le indique lo contrario.  Tmese la temperatura si se siente enfermo. Anote la temperatura en un papel e informe a su mdico que tiene fiebre.  Tome todos los medicamentos segn le indic su mdico.  Cumpla con todas las visitas de control y los papanicolau, segn le indique su mdico. SOLICITE ATENCIN MDICA DE INMEDIATO SI:   Tiene un sangrado ms abundante o que dura ms que el ciclo menstrual normal.  Tiene un sangrado de color rojo brillante.  Elimina cogulos de [place].  Tiene fiebre.  Siente clicos o el dolor no se alivia con Conservation officer, nature.  Siente dolor abdominal que no parece estar relacionado con la misma zona en que sinti los clicos y Conservation officer, historic buildings.  Se siente mareada, dbil o se desmaya.  Comienza a Education officer, environmental al orinar u [employer].  Tiene una secrecin vaginal con mal olor. ASEGRESE DE QUE:   Comprende estas instrucciones.  Controlar su enfermedad.  Solicitar ayuda de inmediato si no mejora o si empeora.   Esta informacin no tiene Marine scientist el consejo del mdico. Asegrese de hacerle al mdico cualquier pregunta que tenga.   Document Released: 03/05/2011 Document Revised: 09/14/2011 Elsevier Interactive Patient Education [employer].

## 2015-04-16 DIAGNOSIS — N6310 Unspecified lump in the right breast, unspecified quadrant: Secondary | ICD-10-CM | POA: Insufficient documentation

## 2015-04-16 NOTE — Addendum Note (Signed)
Encounter addended by: Mack Hook, MD on: 04/16/2015  2:57 PM<BR>     Documentation filed: Problem List

## 2015-04-19 ENCOUNTER — Telehealth: Payer: Self-pay | Admitting: Internal Medicine

## 2015-04-19 NOTE — Telephone Encounter (Signed)
Christine Tapia

## 2015-04-22 NOTE — Telephone Encounter (Signed)
Christine Tapia spoke with patient given instructions to take Ibuprofen 400-800mg  every 6hrs and to go back to [facility] ER if heavy bleeding or severe pain.

## 2015-04-29 ENCOUNTER — Ambulatory Visit (INDEPENDENT_AMBULATORY_CARE_PROVIDER_SITE_OTHER): Payer: No Typology Code available for payment source | Admitting: Family Medicine

## 2015-04-29 VITALS — BP 114/66 | HR 74 | Temp 98.3°F | Resp 16 | Ht 62.0 in | Wt 134.0 lb

## 2015-04-29 DIAGNOSIS — Z0000 Encounter for general adult medical examination without abnormal findings: Secondary | ICD-10-CM

## 2015-04-29 DIAGNOSIS — M546 Pain in thoracic spine: Secondary | ICD-10-CM

## 2015-04-29 NOTE — Patient Instructions (Addendum)
Follow-up at [facility] and decide whether you are going to be seen here or there. We are giving you a flu shot and a tetanus shot today. If you decide to come here, come back in one month. Call and make an appointment. Or make one at [facility]

## 2015-05-01 ENCOUNTER — Encounter: Payer: Self-pay | Admitting: Family Medicine

## 2015-05-01 DIAGNOSIS — M549 Dorsalgia, unspecified: Secondary | ICD-10-CM | POA: Insufficient documentation

## 2015-05-01 NOTE — Progress Notes (Signed)
Patient ID: Emmamarie Kluender, female   DOB: 18-Oct-1975, 39 y.o.   MRN: [id number]   Cletis Muma, is a 39 y.o. female  [id number]  [id number]  DOB - 10-04-1975  CC:  Chief Complaint  Patient presents with  . Establish Care    back pain when breathing in. Pain in pelvic since surgery 04/15/2015.        HPI: Salma Walrond is a 39 y.o. female here to establish care. She was seen at [facility] recently and told to follow-up here. i noticed in her record that had a recent telephone conversation and received advice from Dr. Amil Amen at the [facility] but she denied being a patient there.  Near the end of her visit when I examined her orange card I found that she indeed has been assigned as a patient there.  Here current health problems included a problem with periods which is being handle elsewhere by GYN at [facility]  She was seen about a month ago at ED for thoracic back pain and some breathing difficulty. (It is very hard to get a good history due to language barrier even with interpreter).  Apparently she has has no further breathing problems but continues to have some throracic back pain. It was appartently at this visit that she was instructed to follow-up here. She denies any chronic health problems such as diabetes, hypertension or hyperlipidemia. Her only current medications are for the back pain. She reports having a PAP and mammogram within the lasts year.  She has had recent labs drawn.   No Known Allergies No past medical history on file. Current Outpatient Prescriptions on File Prior to Visit  Medication Sig Dispense Refill  . ibuprofen (ADVIL,MOTRIN) 200 MG tablet Take 200 mg by mouth every 6 (six) hours as needed for moderate pain.    . cyclobenzaprine (FLEXERIL) 10 MG tablet Take 1 tablet (10 mg total) by mouth every 8 (eight) hours as needed for muscle spasms (1 pastilla cada 8 horas si necesita para dolor). (Patient not taking: Reported on 04/29/2015) 15  tablet 0   No current facility-administered medications on file prior to visit.   Family History  Problem Relation Age of Onset  . Cancer Mother   . Hypertension Mother    Social History   Social History  . Marital Status: Single    Spouse Name: N/A  . Number of Children: N/A  . Years of Education: N/A   Occupational History  . Not on file.   Social History Main Topics  . Smoking status: Never Smoker   . Smokeless tobacco: Not on file  . Alcohol Use: No  . Drug Use: No  . Sexual Activity: Yes    Birth Control/ Protection: Surgical   Other Topics Concern  . Not on file   Social History Narrative    Review of Systems: Constitutional: Negative for fever, chills, appetite change, weight loss,  Fatigue. Skin: Negative for rashes or lesions of concern. HENT: Negative for ear pain, ear discharge.nose bleeds Eyes: Negative for pain, discharge, redness,and visual disturbance.Positive for occassional itching  Neck: Negative for pain, stiffness Respiratory: Negative for cough, shortness of breath. She did have the above mentioned episode last month. Cardiovascular: Negative for chest pain, palpitations and leg swelling. Gastrointestinal: Negative for abdominal pain, nausea, vomiting, diarrhea. She reports lower abdominal (pelvic) discomfort and some constipation.  Genitourinary: Negative for dysuria, urgency, frequency, hematuria,  Musculoskeletal: Positive  For upper back pain. Denies fother joint pain, joint  swelling,  and gait problem.Negative for weakness. Neurological: Negative for dizziness, tremors, seizures, syncope,   light-headedness, numbness. She reports daily headaches.  Hematological: Negative for easy bruising or bleeding Psychiatric/Behavioral: Negative for depression, anxiety, decreased concentration, confusion. She does admit to a stressful like due to family problems and a teenage son. GYN: She continues to have some bleeding following a LEEP procedure about a  month ago.   Objective:   Filed Vitals:   04/29/15 1312  BP: 114/66  Pulse: 74  Temp: 98.3 F (36.8 C)  Resp: 16    Physical Exam: Constitutional: Patient appears well-developed and well-nourished. No distress. HENT: Normocephalic, atraumatic, External right and left ear normal. Oropharynx is clear and moist.  Eyes: Conjunctivae and EOM are normal. PERRLA, no scleral icterus. Neck: Normal ROM. Neck supple. No lymphadenopathy, No thyromegaly. CVS: RRR, S1/S2 +, no murmurs, no gallops, no rubs Pulmonary: Effort and breath sounds normal, no stridor, rhonchi, wheezes, rales.  Abdominal: Soft. Normoactive BS,, no distension.rebound or guarding. Generalized lower abdominal tenderness. Musculoskeletal: Normal range of motion. No edema and no tenderness.  Neuro: Alert.Normal muscle tone coordination. Non-focal Skin: Skin is warm and dry. No rash noted. Not diaphoretic. No erythema. No pallor. Psychiatric: Normal mood and affect. Behavior, judgment, thought content normal.  Lab Results  Component Value Date   WBC 10.2 03/21/2015   HGB 10.8* 03/21/2015   HCT 32.8* 03/21/2015   MCV 85.6 03/21/2015   PLT 365 03/21/2015   Lab Results  Component Value Date   CREATININE 0.54 03/21/2015   BUN 11 03/21/2015   NA 139 03/21/2015   K 3.4* 03/21/2015   CL 108 03/21/2015   CO2 22 03/21/2015    No results found for: HGBA1C Lipid Panel  No results found for: CHOL, TRIG, HDL, CHOLHDL, VLDL, LDLCALC     Assessment and plan:   1. Visit to establish care. - I have reviewed information presented by the patient with the help of an interpreter. -I have offered health maintenance items needing addressing -We discussed the need to decide on one primary provider. She will make a decision and either make an appointment to follow-up up here for health maintenance and get her orange card changed or will schedule an appointment at [facility] with Dr. Amil Amen.      The patient was given  clear instructions to go to ER or return to medical center if symptoms don't improve, worsen or new problems develop. The patient verbalized understanding.    Micheline Chapman FNP  05/01/2015, 12:59 PM

## 2015-05-10 ENCOUNTER — Other Ambulatory Visit (INDEPENDENT_AMBULATORY_CARE_PROVIDER_SITE_OTHER): Payer: Self-pay | Admitting: Internal Medicine

## 2015-05-10 DIAGNOSIS — D75839 Thrombocytosis, unspecified: Secondary | ICD-10-CM

## 2015-05-10 DIAGNOSIS — D473 Essential (hemorrhagic) thrombocythemia: Secondary | ICD-10-CM

## 2015-05-11 LAB — CBC WITH DIFFERENTIAL/PLATELET
Basophils Absolute: 0 10*3/uL (ref 0.0–0.2)
Basos: 0 %
EOS (ABSOLUTE): 0.2 10*3/uL (ref 0.0–0.4)
Eos: 3 %
Hematocrit: 35.8 % (ref 34.0–46.6)
Hemoglobin: 11.9 g/dL (ref 11.1–15.9)
Immature Grans (Abs): 0 10*3/uL (ref 0.0–0.1)
Immature Granulocytes: 0 %
Lymphocytes Absolute: 2.9 10*3/uL (ref 0.7–3.1)
Lymphs: 35 %
MCH: 28.6 pg (ref 26.6–33.0)
MCHC: 33.2 g/dL (ref 31.5–35.7)
MCV: 86 fL (ref 79–97)
Monocytes Absolute: 0.5 10*3/uL (ref 0.1–0.9)
Monocytes: 6 %
Neutrophils Absolute: 4.6 10*3/uL (ref 1.4–7.0)
Neutrophils: 56 %
Platelets: 439 10*3/uL — ABNORMAL HIGH (ref 150–379)
RBC: 4.16 x10E6/uL (ref 3.77–5.28)
RDW: 14.7 % (ref 12.3–15.4)
WBC: 8.3 10*3/uL (ref 3.4–10.8)

## 2015-05-15 ENCOUNTER — Telehealth: Payer: Self-pay | Admitting: Internal Medicine

## 2015-05-15 NOTE — Telephone Encounter (Signed)
Patient needs office visit with Md to discuss symptoms.

## 2015-05-15 NOTE — Telephone Encounter (Signed)
05/15/15 patient called stating she has been having black color vaginal discharge, no odor since yesterday with abdominal pain. Patient's last period was around 10/18/216 so she knows it's not her period. Please advice

## 2015-05-16 NOTE — Telephone Encounter (Signed)
Patient scheduled for office visit 05/17/15 @ 3:00pm

## 2015-05-17 ENCOUNTER — Telehealth: Payer: Self-pay | Admitting: Obstetrics and Gynecology

## 2015-05-17 ENCOUNTER — Ambulatory Visit (INDEPENDENT_AMBULATORY_CARE_PROVIDER_SITE_OTHER): Payer: Self-pay | Admitting: Internal Medicine

## 2015-05-17 ENCOUNTER — Encounter: Payer: Self-pay | Admitting: Internal Medicine

## 2015-05-17 VITALS — BP 116/72 | HR 76 | Ht 60.0 in | Wt 134.0 lb

## 2015-05-17 DIAGNOSIS — F409 Phobic anxiety disorder, unspecified: Secondary | ICD-10-CM

## 2015-05-17 DIAGNOSIS — N939 Abnormal uterine and vaginal bleeding, unspecified: Secondary | ICD-10-CM

## 2015-05-17 LAB — POCT HEMOGLOBIN: Hemoglobin: 10.2 g/dL — AB (ref 12.2–16.2)
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
## 2015-05-17 NOTE — Telephone Encounter (Signed)
Received a call from Dr. Amil Amen who is seeing the patient in her office with vaginal bleeding. The patient had a LEEP procedure done a month ago by Dr. Roselie Awkward. The patient denies dizziness, minimal pain. Dr. Amil Amen would like to know where the patient should follow up and if she needed to come to MAU. I recommended that the patient be given thorough bleeding precautions; if heavy bleeding persists, the patient would need to come to MAU. I will send a message to the [place] to have the patient scheduled to be seen in the [place] as soon as available.

## 2015-05-17 NOTE — Patient Instructions (Addendum)
[place] [facility]--Phone number is [phone] (given in separate handout)  Si tiene mucho sangrado o se sienta mariada, [place].  Ellos le deben de llamar el [facility] para darle su cita

## 2015-05-20 ENCOUNTER — Ambulatory Visit: Payer: No Typology Code available for payment source | Admitting: Obstetrics & Gynecology

## 2015-05-22 NOTE — Addendum Note (Signed)
Encounter addended by: Loletta Parish, RN on: 05/22/2015  5:04 PM<BR>     Documentation filed: Charges VN

## 2015-05-23 ENCOUNTER — Ambulatory Visit (INDEPENDENT_AMBULATORY_CARE_PROVIDER_SITE_OTHER): Payer: Self-pay | Admitting: Internal Medicine

## 2015-05-23 ENCOUNTER — Encounter: Payer: Self-pay | Admitting: Internal Medicine

## 2015-05-23 VITALS — BP 110/70 | HR 74 | Ht 60.0 in | Wt 134.0 lb

## 2015-05-23 DIAGNOSIS — K0889 Other specified disorders of teeth and supporting structures: Secondary | ICD-10-CM

## 2015-05-23 MED ORDER — PENICILLIN V POTASSIUM 250 MG PO TABS: 250.0000 mg | ORAL_TABLET | Freq: Four times a day (QID) | ORAL | Status: AC

## 2015-05-23 NOTE — Patient Instructions (Signed)
Ibuprofen 200 mg 2-4 pastillas cada 6 horas a necesita dolor, siempre con comida
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
## 2015-05-24 ENCOUNTER — Ambulatory Visit (INDEPENDENT_AMBULATORY_CARE_PROVIDER_SITE_OTHER): Payer: Self-pay | Admitting: Licensed Clinical Social Worker

## 2015-05-24 DIAGNOSIS — F32A Depression, unspecified: Secondary | ICD-10-CM

## 2015-05-24 DIAGNOSIS — F329 Major depressive disorder, single episode, unspecified: Secondary | ICD-10-CM
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
## 2015-05-29 ENCOUNTER — Ambulatory Visit (INDEPENDENT_AMBULATORY_CARE_PROVIDER_SITE_OTHER): Payer: Self-pay | Admitting: Obstetrics & Gynecology

## 2015-05-29 ENCOUNTER — Encounter: Payer: Self-pay | Admitting: Obstetrics & Gynecology

## 2015-05-29 VITALS — BP 119/67 | HR 69 | Temp 98.4°F | Wt 136.0 lb

## 2015-05-29 DIAGNOSIS — N871 Moderate cervical dysplasia: Secondary | ICD-10-CM

## 2015-05-29 NOTE — Patient Instructions (Signed)
Procedimiento de escisin electroquirrgica con asa - Cuidados posteriores  (Loop Electrosurgical Excision Procedure, Care After) Siga estas instrucciones durante las prximas semanas. Estas indicaciones le proporcionan informacin general acerca de cmo deber cuidarse despus del procedimiento. El mdico tambin podr darle instrucciones especficas. El tratamiento ha sido planificado segn las prcticas mdicas actuales, pero en algunos casos pueden ocurrir problemas. Comunquese con el mdico si tiene algn problema o tiene preguntas despus del procedimiento.  INSTRUCCIONES PARA EL CUIDADO EN EL HOGAR   No use tampones, no se d duchas vaginales ni tenga relaciones sexuales durante 2 semanas, o segn lo que le indique su mdico.  Comience con las actividades habituales si no tiene o tiene mnimo de clicos y Teacher, music, excepto que el mdico le indique lo contrario.  Tmese la temperatura si se siente enfermo. Anote la temperatura en un papel e informe a su mdico que tiene fiebre.  Tome todos los medicamentos segn le indic su mdico.  Cumpla con todas las visitas de control y los papanicolau, segn le indique su mdico. SOLICITE ATENCIN MDICA DE INMEDIATO SI:   Tiene un sangrado ms abundante o que dura ms que el ciclo menstrual normal.  Tiene un sangrado de color rojo brillante.  Elimina cogulos de [place].  Tiene fiebre.  Siente clicos o el dolor no se alivia con Conservation officer, nature.  Siente dolor abdominal que no parece estar relacionado con la misma zona en que sinti los clicos y Conservation officer, historic buildings.  Se siente mareada, dbil o se desmaya.  Comienza a Education officer, environmental al orinar u [employer].  Tiene una secrecin vaginal con mal olor. ASEGRESE DE QUE:   Comprende estas instrucciones.  Controlar su enfermedad.  Solicitar ayuda de inmediato si no mejora o si empeora.   Esta informacin no tiene Marine scientist el consejo del mdico. Asegrese de hacerle al mdico cualquier  pregunta que tenga.   Document Released: 03/05/2011 Document Revised: 09/14/2011 Elsevier Interactive Patient Education [employer].
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
## 2015-05-31 ENCOUNTER — Emergency Department (HOSPITAL_COMMUNITY): Payer: No Typology Code available for payment source

## 2015-05-31 ENCOUNTER — Encounter (HOSPITAL_COMMUNITY): Payer: Self-pay | Admitting: Emergency Medicine

## 2015-05-31 ENCOUNTER — Emergency Department (HOSPITAL_COMMUNITY)
Admission: EM | Admit: 2015-05-31 | Discharge: 2015-05-31 | Disposition: A | Discharge status: Home / Self Care | Payer: No Typology Code available for payment source | Attending: Emergency Medicine | Admitting: Emergency Medicine

## 2015-05-31 DIAGNOSIS — Z8639 Personal history of other endocrine, nutritional and metabolic disease: Secondary | ICD-10-CM | POA: Insufficient documentation

## 2015-05-31 DIAGNOSIS — Z3202 Encounter for pregnancy test, result negative: Secondary | ICD-10-CM | POA: Insufficient documentation

## 2015-05-31 DIAGNOSIS — R531 Weakness: Secondary | ICD-10-CM | POA: Insufficient documentation

## 2015-05-31 DIAGNOSIS — Z792 Long term (current) use of antibiotics: Secondary | ICD-10-CM | POA: Insufficient documentation

## 2015-05-31 DIAGNOSIS — Z8659 Personal history of other mental and behavioral disorders: Secondary | ICD-10-CM | POA: Insufficient documentation

## 2015-05-31 DIAGNOSIS — M545 Low back pain: Secondary | ICD-10-CM

## 2015-05-31 LAB — I-STAT BETA HCG BLOOD, ED (MC, WL, AP ONLY): I-stat hCG, quantitative: <5 m[IU]/mL (ref <5)

## 2015-05-31 MED ORDER — DIAZEPAM 5 MG PO TABS
5.0000 mg | ORAL_TABLET | Freq: Once | ORAL | Status: AC
  Administered 2015-05-31: 5 mg via ORAL
  Filled 2015-05-31: qty 1

## 2015-05-31 MED ORDER — CYCLOBENZAPRINE HCL 10 MG PO TABS: 10.0000 mg | ORAL_TABLET | Freq: Two times a day (BID) | ORAL | Status: DC | PRN

## 2015-05-31 MED ORDER — HYDROCODONE-ACETAMINOPHEN 5-325 MG PO TABS: 2.0000 | ORAL_TABLET | ORAL | Status: DC | PRN

## 2015-05-31 MED ORDER — HYDROCODONE-ACETAMINOPHEN 5-325 MG PO TABS
1.0000 | ORAL_TABLET | Freq: Once | ORAL | Status: AC
Start: 2015-05-31 — End: 2015-05-31
  Administered 2015-05-31: 1 via ORAL
  Filled 2015-05-31: qty 1

## 2015-05-31 NOTE — ED Notes (Addendum)
Per [facility] pt from home , co right lower back pain radiating down to right leg. No recent injury nor fall. Denies Hx sciatica. Pt speaks No English.

## 2015-05-31 NOTE — ED Notes (Signed)
Bed: WA03 Expected date:  Expected time:  Means of arrival:  Comments: EMS-leg/back pain

## 2015-05-31 NOTE — ED Notes (Signed)
PT DISCHARGED. INSTRUCTIONS AND PRESCRIPTIONS GIVEN. AAOX3. PT IN NO APPARENT DISTRESS. THE OPPORTUNITY TO ASK QUESTIONS WAS PROVIDED. 
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
## 2015-05-31 NOTE — Discharge Instructions (Signed)
Take your medications as prescribed as needed for pain relief. You may also apply heat for 15-20 minutes 3-4 times daily to your lower back for pain relief. Follow-up with your primary care provider in 3 days. Please follow up with a primary care provider from the Resource Guide provided below in fever, numbness, tingling, groin anesthesia, loss of bowel or bladder.

## 2015-06-03 ENCOUNTER — Telehealth: Payer: Self-pay | Admitting: *Deleted

## 2015-06-03 NOTE — Telephone Encounter (Signed)
Called Christine Tapia with [facility] and notified her Leep shows CIn2 and reccomendation is pap with cotesting in 12 months. She states she saw doctor last week and he told her this. No questions. I apologized but explained he had sent a message to call patient with results. She voices understanding.

## 2015-06-06 ENCOUNTER — Ambulatory Visit: Payer: No Typology Code available for payment source | Admitting: Obstetrics & Gynecology

## 2015-06-06 ENCOUNTER — Other Ambulatory Visit: Payer: Self-pay | Admitting: Licensed Clinical Social Worker

## 2015-06-07 ENCOUNTER — Ambulatory Visit (INDEPENDENT_AMBULATORY_CARE_PROVIDER_SITE_OTHER): Payer: Self-pay | Admitting: Internal Medicine

## 2015-06-07 ENCOUNTER — Encounter: Payer: Self-pay | Admitting: Internal Medicine

## 2015-06-07 ENCOUNTER — Ambulatory Visit (INDEPENDENT_AMBULATORY_CARE_PROVIDER_SITE_OTHER): Payer: Self-pay | Admitting: Licensed Clinical Social Worker

## 2015-06-07 VITALS — BP 110/70 | HR 68 | Ht 60.0 in | Wt 134.0 lb

## 2015-06-07 DIAGNOSIS — F32A Depression, unspecified: Secondary | ICD-10-CM

## 2015-06-07 DIAGNOSIS — M5417 Radiculopathy, lumbosacral region: Secondary | ICD-10-CM

## 2015-06-07 DIAGNOSIS — F329 Major depressive disorder, single episode, unspecified: Secondary | ICD-10-CM

## 2015-06-07 NOTE — Progress Notes (Signed)
   Subjective:    Patient ID: Christine Tapia, female    DOB: 1975-09-17, 39 y.o.   MRN: [id number]  HPI  Pt. Went to ED Friday after Thanksgiving.  Could not wait until appt. On Monday because of pain,so went to ED. Started with numbness in right leg on the 25th of November (day seen in ED)  As moved around, developed pain in bilateral lower back.  Was diagnosed with low back pain with radiculopathy.  LS spine films were normal.   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  Out of the Hydrocodone, but Cyclobenzaprine still left and helping her sleep and relax her back muscles.  Her right knee is bothering her now.        Review of Systems     Objective:   Physical Exam  Lungs:  CTA CV:  RRR without murmur or rub MS:  Point tenderness over upper sacral spinous process.  Mild tenderness over left paraspinous musculature.  Minimal decrease in forward flexion ROM.  Full extension.  Obvious discomfort with forward flexion and more so with return to upright posture. + straight leg raise. Right knee with full ROM, NT on palpation of joint lines.No effusion Neuro:  Strength of right leg difficult to evaluate due to pain, but able to resist opposing forces, just decreased compared to left.  DTRs 2+/4 throughout. Sensory grossly normal        Assessment & Plan:  Right lumbosacral radiculopathy:  Add Ibuprofen for pain control and to take Cyclobenzaprine at night mainly.  Discussed and showed using legs for obtaining objects at low level, rather than bending over from waist.   Letter to be off work until Monday, Dec. 5
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
## 2015-06-07 NOTE — Patient Instructions (Signed)
Ibuprofen 200 mg 3-4 pastillas dos veces al dia con comida [place], [place], y Domingo--o puede tomar cada 6 horas a Radiographer, therapeutic, si tiene mas dolor

## 2015-06-14 ENCOUNTER — Other Ambulatory Visit: Payer: Self-pay | Admitting: Licensed Clinical Social Worker

## 2015-06-18 ENCOUNTER — Telehealth: Payer: Self-pay | Admitting: Licensed Clinical Social Worker

## 2015-06-18 NOTE — Telephone Encounter (Signed)
Called patient after her no-show last week and left a voicemail. This was the third attempt at contacting for rescheduling.

## 2016-02-05 ENCOUNTER — Encounter: Payer: Self-pay | Admitting: Internal Medicine

## 2016-02-05 ENCOUNTER — Ambulatory Visit (INDEPENDENT_AMBULATORY_CARE_PROVIDER_SITE_OTHER): Payer: Self-pay | Admitting: Internal Medicine

## 2016-02-05 VITALS — BP 100/60 | HR 64 | Temp 98.2°F | Resp 16 | Ht 60.0 in | Wt 135.0 lb

## 2016-02-05 DIAGNOSIS — F458 Other somatoform disorders: Secondary | ICD-10-CM

## 2016-02-05 DIAGNOSIS — M5412 Radiculopathy, cervical region: Secondary | ICD-10-CM

## 2016-02-05 MED ORDER — CYCLOBENZAPRINE HCL 5 MG PO TABS: 10.0000 mg | ORAL_TABLET | Freq: Every day | ORAL | 0 refills | Status: DC

## 2016-02-05 MED ORDER — NAPROXEN 500 MG PO TABS: 500.0000 mg | ORAL_TABLET | Freq: Two times a day (BID) | ORAL | 1 refills | Status: DC
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
## 2016-03-05 ENCOUNTER — Ambulatory Visit (INDEPENDENT_AMBULATORY_CARE_PROVIDER_SITE_OTHER): Payer: Self-pay | Admitting: Internal Medicine

## 2016-03-05 ENCOUNTER — Encounter: Payer: Self-pay | Admitting: Internal Medicine

## 2016-03-05 ENCOUNTER — Ambulatory Visit (INDEPENDENT_AMBULATORY_CARE_PROVIDER_SITE_OTHER): Payer: Self-pay | Admitting: Licensed Clinical Social Worker

## 2016-03-05 VITALS — BP 102/68 | HR 68 | Resp 16 | Ht 60.5 in | Wt 134.0 lb

## 2016-03-05 DIAGNOSIS — T7491XA Unspecified adult maltreatment, confirmed, initial encounter: Secondary | ICD-10-CM

## 2016-03-05 DIAGNOSIS — T7411XD Adult physical abuse, confirmed, subsequent encounter: Secondary | ICD-10-CM
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
## 2016-03-10 ENCOUNTER — Telehealth: Payer: Self-pay | Admitting: Internal Medicine

## 2016-03-10 NOTE — Telephone Encounter (Signed)
Patient came in requesting Progress notes and photos from 03/05/16 to be faxed to Glennon Mac at [phone] Ext: [phone]. Patient informed we need a signed release of information from [facility] before we can fax over records. Called Glennon Mac and left voicemail with above info.

## 2016-03-16 NOTE — Telephone Encounter (Signed)
Patient came to the office today requesting the photographs. Dr. Amil Amen sent them to the patient and is going to put them in the chart with [facility] as soon as she can finish the note.

## 2016-03-18 ENCOUNTER — Telehealth: Payer: Self-pay | Admitting: Internal Medicine

## 2016-03-18 NOTE — Telephone Encounter (Signed)
See next phone call--to pick up copy of last office visit--or fax to [facility] who requested the note.p

## 2016-03-18 NOTE — Telephone Encounter (Signed)
Patient to come in and pick up documents tomorrow morning

## 2016-03-18 NOTE — Telephone Encounter (Signed)
Pt informed

## 2016-03-31 ENCOUNTER — Other Ambulatory Visit (HOSPITAL_COMMUNITY): Payer: Self-pay | Admitting: *Deleted

## 2016-03-31 DIAGNOSIS — N6310 Unspecified lump in the right breast, unspecified quadrant: Secondary | ICD-10-CM

## 2016-04-02 ENCOUNTER — Telehealth: Payer: Self-pay | Admitting: Licensed Clinical Social Worker
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
## 2016-04-10 ENCOUNTER — Other Ambulatory Visit: Payer: Self-pay | Admitting: Licensed Clinical Social Worker

## 2016-04-16 ENCOUNTER — Ambulatory Visit (HOSPITAL_COMMUNITY)
Admission: RE | Admit: 2016-04-16 | Discharge: 2016-04-16 | Disposition: A | Discharge status: Home / Self Care | Payer: Self-pay | Source: Ambulatory Visit | Attending: Obstetrics and Gynecology | Admitting: Obstetrics and Gynecology

## 2016-04-16 ENCOUNTER — Ambulatory Visit
Admission: RE | Admit: 2016-04-16 | Discharge: 2016-04-16 | Disposition: A | Discharge status: Home / Self Care | Payer: No Typology Code available for payment source | Source: Ambulatory Visit | Attending: Obstetrics and Gynecology | Admitting: Obstetrics and Gynecology

## 2016-04-16 ENCOUNTER — Encounter (HOSPITAL_COMMUNITY): Payer: Self-pay

## 2016-04-16 VITALS — BP 108/60 | Temp 98.0°F | Ht 60.0 in | Wt 136.4 lb

## 2016-04-16 DIAGNOSIS — N6310 Unspecified lump in the right breast, unspecified quadrant: Secondary | ICD-10-CM

## 2016-04-16 DIAGNOSIS — Z01419 Encounter for gynecological examination (general) (routine) without abnormal findings: Secondary | ICD-10-CM

## 2016-04-16 NOTE — Progress Notes (Signed)
No complaints today. Patient referred to [facility] due to last right breast diagnostic mammogram and ultrasound was completed 03/05/2015 and 6 month follow up was recommended that wasn't completed.   Pap Smear: Pap smear completed today. Last Pap smear was 02/04/2015 at [facility] and ASCUS with positive HPV. Per patient her last Pap smear is the only abnormal Pap smear she has had. 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 Last Pap smear, colposcopy, and LEEP results are in EPIC.  Physical exam: Breasts Breasts symmetrical. No skin abnormalities bilateral breasts. No nipple retraction bilateral breasts. No nipple discharge bilateral breasts. No lymphadenopathy. No lumps palpated bilateral breasts. No complaints of pain or tenderness on exam. Referred patient to the [facility] for a diagnostic mammogram and possible right breast ultrasound per recommendation. Appointment scheduled for Thursday, April 16, 2016 at 0900.  Pelvic/Bimanual   Ext Genitalia No lesions, no swelling and no discharge observed on external genitalia.         Vagina Vagina pink and normal texture. No lesions or discharge observed in vagina.          Cervix Cervix is present. Cervix pink and of normal texture. No discharge observed.     Uterus Uterus is present and palpable. Uterus is retroverted and normal size.        Adnexae Bilateral ovaries present and palpable. No tenderness on palpation.          Rectovaginal No rectal exam completed today since patient had no rectal complaints. No skin abnormalities observed on exam.    Smoking History: Patient has never smoked.  Patient Navigation: Patient education provided. Access to services provided for patient through [facility] program. Spanish interpreter provided.   Used Spanish interpreter [employer] from [place].

## 2016-04-16 NOTE — Patient Instructions (Signed)
Explained breast self awareness with [place]. Let her know if today's Pap smear is normal and HPV negative that her next one will be due in one year due to her history of an abnormal Pap smear last year. Referred patient to the [facility] for a diagnostic mammogram and possible right breast ultrasound per recommendation. Appointment scheduled for Thursday, April 16, 2016 at 0900. Let patient know will follow up with her within the next couple weeks with results of Pap smear by phone. Hallee Donald Prose Manna verbalized understanding.  Harvey Matlack, Arvil Chaco, RN 10:25 AM

## 2016-04-20 ENCOUNTER — Encounter (HOSPITAL_COMMUNITY): Payer: Self-pay | Admitting: *Deleted

## 2016-04-20 ENCOUNTER — Telehealth (HOSPITAL_COMMUNITY): Payer: Self-pay | Admitting: *Deleted

## 2016-04-20 LAB — CYTOLOGY - PAP
Diagnosis: NEGATIVE
HPV: NOT DETECTED

## 2016-04-20 NOTE — Telephone Encounter (Signed)
Telephoned patient at home number and discussed negative pap smear results. HPV was negative. Next pap smear due in one year due to history of abnormal pap smears. Patient voiced understanding. Used interpreter Lavon Paganini.

## 2016-06-04 ENCOUNTER — Ambulatory Visit (INDEPENDENT_AMBULATORY_CARE_PROVIDER_SITE_OTHER): Payer: Self-pay | Admitting: Internal Medicine

## 2016-06-04 ENCOUNTER — Encounter: Payer: Self-pay | Admitting: Internal Medicine

## 2016-06-04 VITALS — BP 100/60 | HR 60 | Resp 18 | Ht 60.0 in | Wt 132.8 lb

## 2016-06-04 DIAGNOSIS — Z23 Encounter for immunization: Secondary | ICD-10-CM

## 2016-06-04 DIAGNOSIS — Z91410 Personal history of adult physical and sexual abuse: Secondary | ICD-10-CM

## 2016-06-04 DIAGNOSIS — J3089 Other allergic rhinitis: Secondary | ICD-10-CM

## 2016-06-04 DIAGNOSIS — IMO0002 Reserved for concepts with insufficient information to code with codable children: Secondary | ICD-10-CM

## 2016-06-04 MED ORDER — CETIRIZINE HCL 10 MG PO TABS: 10.0000 mg | ORAL_TABLET | Freq: Every day | ORAL | 11 refills | Status: DC

## 2016-06-04 NOTE — Patient Instructions (Signed)
Ask at pharmacy for OTC lubricating eye drops to use as needed.
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
## 2016-07-12 DIAGNOSIS — IMO0002 Reserved for concepts with insufficient information to code with codable children: Secondary | ICD-10-CM | POA: Insufficient documentation

## 2016-10-08 ENCOUNTER — Ambulatory Visit: Payer: Self-pay | Admitting: Internal Medicine

## 2017-08-12 ENCOUNTER — Encounter: Payer: Self-pay | Admitting: Internal Medicine

## 2017-08-12 ENCOUNTER — Ambulatory Visit: Payer: Self-pay | Admitting: Internal Medicine

## 2017-08-12 VITALS — BP 102/80 | HR 72 | Resp 12 | Ht 60.0 in | Wt 141.0 lb

## 2017-08-12 DIAGNOSIS — G44209 Tension-type headache, unspecified, not intractable: Secondary | ICD-10-CM

## 2017-08-12 DIAGNOSIS — F439 Reaction to severe stress, unspecified: Secondary | ICD-10-CM

## 2017-08-12 MED ORDER — CYCLOBENZAPRINE HCL 10 MG PO TABS: ORAL_TABLET | ORAL | 0 refills | Status: DC

## 2017-08-12 NOTE — Progress Notes (Signed)
Subjective:     Patient ID: Christine Tapia, female   DOB: 04-28-1976, 42 y.o.   MRN: [id number]  HPI   Headache for 8 days:  Has had this in the past twice.  The first episode was at age 16 yo.  Last was at age 62.   Her neck hurts to nuchal area and up over the top of her head.   Not clear what her description is--initially states squeezing in nature and then exploding feeling.   Pain spreading down right shoulder into arm. Does not remember any injury or waking from sleeping with odd head position. Awakened with a milder headache on first day in the morning and gradually worsened to current pain level of 10 out or 10.  May be seeing little lights in visual field.   States lights bothers her eyes, not clear if true photophobia. No nausea or vomiting.  Has taken ibuprofen 800 mg with minimal help.  She is under a lot of stress.  Her son is now in jail. She thinks he is in [place] now, but not clear how long he will be there. She denies suicidal idation  Current Meds  Medication Sig  . ibuprofen (ADVIL,MOTRIN) 200 MG tablet Take 200 mg by mouth every 6 (six) hours as needed.    No Known Allergies  Review of Systems     Objective:   Physical Exam  NAD HEENT:  PERRL, EOMI, discs sharp, TMs pearly gray, throat without injection.   Neck:  Supple, but tender across traps and over paraspinous musculature to nuchal ridge Chest:  CTA CV:  RRR without murmur or rub, carotid, radial an DP pulse normal and equal. Abd:  S, NT, No HSM or mass. + BS Neuro:  A & O x 3, CN  II-XII grossly intact. DTRs 2+/4, Motor is 5/5, sensory grossly normal.  Gait normal.     Assessment/Plan:   1.  Muscle Tension Headache:  Cyclobenzaprine, particularly at bedtime. Ibuprofen 400-800 mg twice daily with meals  2.  Stress:  Referral to Macie Burows, LCSW for work on this.         Cyclobenzaprine

## 2017-09-13 ENCOUNTER — Encounter: Payer: Self-pay | Admitting: Internal Medicine

## 2018-06-10 ENCOUNTER — Encounter (HOSPITAL_COMMUNITY): Payer: Self-pay

## 2018-06-10 ENCOUNTER — Other Ambulatory Visit: Payer: Self-pay

## 2018-06-10 ENCOUNTER — Emergency Department (HOSPITAL_COMMUNITY)
Admission: EM | Admit: 2018-06-10 | Discharge: 2018-06-10 | Disposition: A | Discharge status: Home / Self Care | Payer: No Typology Code available for payment source | Attending: Emergency Medicine | Admitting: Emergency Medicine

## 2018-06-10 DIAGNOSIS — M5412 Radiculopathy, cervical region: Secondary | ICD-10-CM | POA: Insufficient documentation

## 2018-06-10 MED ORDER — KETOROLAC TROMETHAMINE 15 MG/ML IJ SOLN
30.0000 mg | Freq: Once | INTRAMUSCULAR | Status: AC
  Administered 2018-06-10: 30 mg via INTRAMUSCULAR
  Filled 2018-06-10: qty 2

## 2018-06-10 MED ORDER — PREDNISONE 10 MG (21) PO TBPK: ORAL_TABLET | Freq: Every day | ORAL | 0 refills | Status: DC
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
## 2018-06-10 NOTE — Discharge Instructions (Signed)
Take prednisone as prescribed.  Take Tylenol or Motrin as needed for pain.  Follow-up with your primary care provider in 2 days for continued evaluation.  Return to the ED immediately for new or worsening symptoms, such as weakness, numbness, tingling, chest pain, shortness of breath, fevers or any concerns at all.

## 2018-06-10 NOTE — ED Triage Notes (Signed)
Pt states that her right neck down through arm has been hurting since Monday. Pt denies injury/trauma. Pt states she has a hx of shoulder issues, which is usually relieved with meds. Pt states that she tried ibuprofen without relief.  Pt is Spanish speaking, interpreter used [place] [phone]

## 2018-09-07 ENCOUNTER — Telehealth: Payer: Self-pay

## 2018-09-07 NOTE — Telephone Encounter (Signed)
Patient called states she has been having left sided chest pain for 10 days. States it makes it hard to breathe. Patient advised to got to urgent care. Patient also complaining of headache associated with chest pain. Patient verbalized understanding and will go to urgent care and call back to follow up

## 2019-10-12 ENCOUNTER — Encounter: Payer: Self-pay | Admitting: Clinical

## 2019-10-12 ENCOUNTER — Ambulatory Visit: Payer: Self-pay | Admitting: Internal Medicine

## 2019-10-12 ENCOUNTER — Encounter: Payer: Self-pay | Admitting: Internal Medicine

## 2019-10-12 ENCOUNTER — Other Ambulatory Visit: Payer: Self-pay

## 2019-10-12 VITALS — BP 102/72 | HR 78 | Resp 12 | Ht 60.0 in | Wt 147.0 lb

## 2019-10-12 DIAGNOSIS — Z1231 Encounter for screening mammogram for malignant neoplasm of breast: Secondary | ICD-10-CM
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
## 2019-10-16 ENCOUNTER — Other Ambulatory Visit: Payer: Self-pay

## 2019-10-16 DIAGNOSIS — Z1231 Encounter for screening mammogram for malignant neoplasm of breast: Secondary | ICD-10-CM

## 2019-11-09 ENCOUNTER — Ambulatory Visit
Admission: RE | Admit: 2019-11-09 | Discharge: 2019-11-09 | Disposition: A | Discharge status: Home / Self Care | Payer: No Typology Code available for payment source | Source: Ambulatory Visit | Attending: Obstetrics and Gynecology | Admitting: Obstetrics and Gynecology

## 2019-11-09 ENCOUNTER — Ambulatory Visit: Payer: Self-pay | Admitting: Medical

## 2019-11-09 ENCOUNTER — Other Ambulatory Visit: Payer: Self-pay

## 2019-11-09 VITALS — BP 118/74 | Temp 97.3°F | Wt 150.0 lb

## 2019-11-09 DIAGNOSIS — Z1231 Encounter for screening mammogram for malignant neoplasm of breast: Secondary | ICD-10-CM

## 2019-11-09 NOTE — Patient Instructions (Signed)
Mammogram °A mammogram is an X-ray of the breasts that is done to check for changes that are not normal. This test can screen for and find any changes that may suggest breast cancer. Mammograms are regularly done on women. A man may have a mammogram if he has a lump or swelling in his breast. This test can also help to find other changes and variations in the breast. °Tell a doctor: °· About any allergies you have. °· If you have breast implants. °· If you have had previous breast disease, biopsy, or surgery. °· If you are breastfeeding. °· If you are younger than age 25. °· If you have a family history of breast cancer. °· Whether you are pregnant or may be pregnant. °What are the risks? °Generally, this is a safe procedure. However, problems may occur, including: °· Exposure to radiation. Radiation levels are very low with this test. °· The results being misinterpreted. °· The need for further tests. °· The inability of the mammogram to detect certain cancers. °What happens before the procedure? °· Have this test done about 1-2 weeks after your period. This is usually when your breasts are the least tender. °· If you are visiting a new doctor or clinic, send any past mammogram images to your new doctor's office. °· Wash your breasts and under your arms the day of the test. °· Do not use deodorants, perfumes, lotions, or powders on the day of the test. °· Take off any jewelry from your neck. °· Wear clothes that you can change into and out of easily. °What happens during the procedure? ° °· You will undress from the waist up. You will put on a gown. °· You will stand in front of the X-ray machine. °· Each breast will be placed between two plastic or glass plates. The plates will press down on your breast for a few seconds. Try to stay as relaxed as possible. This does not cause any harm to your breasts. Any discomfort you feel will be very brief. °· X-rays will be taken from different angles of each breast. °The  procedure may vary among doctors and hospitals. °What happens after the procedure? °· The mammogram will be read by a specialist (radiologist). °· You may need to do certain parts of the test again. This depends on the quality of the images. °· Ask when your test results will be ready. Make sure you get your test results. °· You may go back to your normal activities. °Summary °· A mammogram is a low energy X-ray of the breasts that is done to check for abnormal changes. A man may have this test if he has a lump or swelling in his breast. °· Before the procedure, tell your doctor about any breast problems that you have had in the past. °· Have this test done about 1-2 weeks after your period. °· For the test, each breast will be placed between two plastic or glass plates. The plates will press down on your breast for a few seconds. °· The mammogram will be read by a specialist (radiologist). Ask when your test results will be ready. Make sure you get your test results. °This information is not intended to replace advice given to you by your health care provider. Make sure you discuss any questions you have with your health care provider. °Document Revised: 02/10/2018 Document Reviewed: 02/10/2018 °Elsevier Patient Education © 2020 [employer] Inc. ° °

## 2019-11-09 NOTE — Progress Notes (Signed)
Ms. Christine Tapia is a 44 y.o. female who presents to [facility] clinic today with no complaints.    Pap Smear: Pap not smear completed today. Last Pap smear was 04/16/2016 at [facility] clinic and was normal. Per patient has no history of an abnormal Pap smear. Last Pap smear result is not available in Epic.   Physical exam: Breasts Breasts symmetrical. No skin abnormalities bilateral breasts. No nipple retraction bilateral breasts. No nipple discharge bilateral breasts. No lymphadenopathy. No lumps palpated bilateral breasts.       Pelvic/Bimanual Pap is not indicated today    Smoking History: Patient has never smoked.    Patient Navigation: Patient education provided. Access to services provided for patient through [facility] program. Interpreter provided.    Colorectal Cancer Screening: Per patient has never had colonoscopy completed No complaints today.    Breast and Cervical Cancer Risk Assessment: Patient does not have family history of breast cancer, known genetic mutations, or radiation treatment to the chest before age 54. Patient does not have history of cervical dysplasia, immunocompromised, or DES exposure in-utero.  Risk Assessment    Risk Scores      11/09/2019   Last edited by: Demetrius Revel, LPN   5-year risk: 0.4 %   Lifetime risk: 4.9 %          A: [facility] exam without pap smear  P: Referred patient to the [facility] for a screening mammogram. Appointment scheduled 11/09/19 @ 1:30pm.  Luvenia Redden, PA-C 11/09/2019 12:55 PM

## 2019-12-22 ENCOUNTER — Other Ambulatory Visit: Payer: Self-pay

## 2019-12-22 DIAGNOSIS — Z13220 Encounter for screening for lipoid disorders: Secondary | ICD-10-CM

## 2019-12-22 DIAGNOSIS — Z79899 Other long term (current) drug therapy: Secondary | ICD-10-CM

## 2019-12-23 LAB — CBC WITH DIFFERENTIAL/PLATELET
Basophils Absolute: 0.1 10*3/uL (ref 0.0–0.2)
Basos: 1 %
EOS (ABSOLUTE): 0.1 10*3/uL (ref 0.0–0.4)
Eos: 2 %
Hematocrit: 34.3 % (ref 34.0–46.6)
Hemoglobin: 10.6 g/dL — ABNORMAL LOW (ref 11.1–15.9)
Immature Grans (Abs): 0 10*3/uL (ref 0.0–0.1)
Immature Granulocytes: 0 %
Lymphocytes Absolute: 2.1 10*3/uL (ref 0.7–3.1)
Lymphs: 28 %
MCH: 24.9 pg — ABNORMAL LOW (ref 26.6–33.0)
MCHC: 30.9 g/dL — ABNORMAL LOW (ref 31.5–35.7)
MCV: 81 fL (ref 79–97)
Monocytes Absolute: 0.6 10*3/uL (ref 0.1–0.9)
Monocytes: 8 %
Neutrophils Absolute: 4.6 10*3/uL (ref 1.4–7.0)
Neutrophils: 61 %
Platelets: 498 10*3/uL — ABNORMAL HIGH (ref 150–450)
RBC: 4.26 x10E6/uL (ref 3.77–5.28)
RDW: 15.4 % (ref 11.7–15.4)
WBC: 7.5 10*3/uL (ref 3.4–10.8)

## 2019-12-23 LAB — COMPREHENSIVE METABOLIC PANEL
ALT: 18 IU/L (ref 0–32)
AST: 20 IU/L (ref 0–40)
Albumin/Globulin Ratio: 1.7 (ref 1.2–2.2)
Albumin: 4.9 g/dL — ABNORMAL HIGH (ref 3.8–4.8)
Alkaline Phosphatase: 91 IU/L (ref 48–121)
BUN/Creatinine Ratio: 20 (ref 9–23)
BUN: 12 mg/dL (ref 6–24)
Bilirubin Total: 0.3 mg/dL (ref 0.0–1.2)
CO2: 20 mmol/L (ref 20–29)
Calcium: 9.2 mg/dL (ref 8.7–10.2)
Chloride: 101 mmol/L (ref 96–106)
Creatinine, Ser: 0.61 mg/dL (ref 0.57–1.00)
GFR calc Af Amer: 127 mL/min/{1.73_m2} (ref >59)
GFR calc non Af Amer: 110 mL/min/{1.73_m2} (ref >59)
Globulin, Total: 2.9 g/dL (ref 1.5–4.5)
Glucose: 88 mg/dL (ref 65–99)
Potassium: 4.4 mmol/L (ref 3.5–5.2)
Sodium: 138 mmol/L (ref 134–144)
Total Protein: 7.8 g/dL (ref 6.0–8.5)

## 2019-12-23 LAB — LIPID PANEL W/O CHOL/HDL RATIO
Cholesterol, Total: 266 mg/dL — ABNORMAL HIGH (ref 100–199)
HDL: 58 mg/dL (ref >39)
LDL Chol Calc (NIH): 172 mg/dL — ABNORMAL HIGH (ref 0–99)
Triglycerides: 195 mg/dL — ABNORMAL HIGH (ref 0–149)
VLDL Cholesterol Cal: 36 mg/dL (ref 5–40)

## 2019-12-28 ENCOUNTER — Encounter: Payer: Self-pay | Admitting: Internal Medicine

## 2019-12-28 ENCOUNTER — Other Ambulatory Visit: Payer: Self-pay | Admitting: Internal Medicine

## 2019-12-28 ENCOUNTER — Ambulatory Visit: Payer: Self-pay | Admitting: Internal Medicine

## 2019-12-28 VITALS — BP 102/70 | HR 70 | Resp 12 | Ht 60.0 in | Wt 149.0 lb

## 2019-12-28 DIAGNOSIS — R1084 Generalized abdominal pain: Secondary | ICD-10-CM

## 2019-12-28 DIAGNOSIS — Z0000 Encounter for general adult medical examination without abnormal findings: Secondary | ICD-10-CM

## 2019-12-28 DIAGNOSIS — E782 Mixed hyperlipidemia: Secondary | ICD-10-CM

## 2019-12-28 DIAGNOSIS — K921 Melena: Secondary | ICD-10-CM

## 2019-12-28 DIAGNOSIS — K029 Dental caries, unspecified: Secondary | ICD-10-CM

## 2019-12-28 DIAGNOSIS — D649 Anemia, unspecified: Secondary | ICD-10-CM

## 2019-12-28 DIAGNOSIS — Z124 Encounter for screening for malignant neoplasm of cervix: Secondary | ICD-10-CM

## 2019-12-28 DIAGNOSIS — H547 Unspecified visual loss: Secondary | ICD-10-CM

## 2019-12-28 MED ORDER — FAMOTIDINE 40 MG PO TABS: 40.0000 mg | ORAL_TABLET | Freq: Every day | ORAL | 2 refills | Status: DC

## 2019-12-28 NOTE — Progress Notes (Signed)
Subjective:    Patient ID: Christine Tapia, female   DOB: [birth year], 44 y.o.   MRN: [id number]   HPI   CPE with pap  1.  Pap:  Last pap was 04/16/2016 and normal.  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  Mammogram:  Last mammogram 11/09/2019.  No family history of breast cancer.  5 pregnancies and breast fed her children.  3.  Osteoprevention:  Maybe one serving of dairy daily.  She would be willing to drink 3-4 cups milk daily.  She works out on stationary bike 15 minutes daily and willing to increase to 30 minutes daily.  4.  Guaiac Cards:  Never  5.  Colonoscopy:  Never.  No family history of colon cancer.  History of anemia in 2019, for which she did not return for iron studies or home guaiac card testing.   6.  Immunizations:   Immunization History  Administered Date(s) Administered  . Influenza Inj Mdck Quad Pf 06/04/2016  . PFIZER SARS-COV-2 Vaccination 10/26/2019, 11/22/2019     7.  Glucose/Cholesterol:  Fasting blood glucose recently normal at 88.   Cholesterol high.   Lipid Panel     Component Value Date/Time   CHOL 266 (H) 12/22/2019 0907   TRIG 195 (H) 12/22/2019 0907   HDL 58 12/22/2019 0907   LDLCALC 172 (H) 12/22/2019 0907   LABVLDL 36 12/22/2019 0907       Current Meds  Medication Sig  . acetaminophen (TYLENOL) 500 MG tablet Take 1,000 mg by mouth every 8 (eight) hours as needed for moderate pain.  Marland Kitchen ibuprofen (ADVIL,MOTRIN) 200 MG tablet Take 200 mg by mouth every 6 (six) hours as needed.   Allergies  Allergen Reactions  . Penicillins    Past Medical History:  Diagnosis Date  . 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 Depression    History of Reactive depression:  Her older son, [place], was [place].  She had to pay ransom.  He was then shot in the head in [place] and had a long convalescence.   Currently also in a concerning relationship.  Marland Kitchen  Hyperlipidemia 02/2015   Elevated trigs and LDL  . Neck pain 2016    Past Surgical History:  Procedure Laterality Date  . CERVICAL BIOPSY  W/ LOOP ELECTRODE EXCISION  04/15/2015   Dr Roselie Awkward  . COLPOSCOPY    . TUBAL LIGATION      Family History  Problem Relation Age of Onset  . Cancer Mother 7       Uterine  . Hypertension Mother   . Hypertension Sister   . Depression Daughter      Social History   Socioeconomic History  . Marital status: Single    Spouse name: Not on file  . Number of children: 4  . Years of education: college  . Highest education level: Some college, no degree  Occupational History  . Occupation: Food Truck  Tobacco Use  . Smoking status: Never Smoker  . Smokeless tobacco: Never Used  Vaping Use  . Vaping Use: Never used  Substance and Sexual Activity  . Alcohol use: No    Alcohol/week: 0.0 standard drinks  . Drug use: No  . Sexual activity: Not Currently    Birth control/protection: Surgical  Other Topics Concern  . Not on file  Social History Narrative   Originally from  [place]   Came to [place]. In 2015   Lives with 3 other adults in an apt.     3 children are in [place]   1 son lives in [place] Strain: Medium Risk  . Difficulty of Paying Living Expenses: Somewhat hard  Food Insecurity: No Food Insecurity  . Worried About Charity fundraiser in the Last Year: Never true  . Ran Out of Food in the Last Year: Never true  Transportation Needs: No Transportation Needs  . Lack of Transportation (Medical): No  . Lack of Transportation (Non-Medical): No  Physical Activity:   . Days of Exercise per Week:   . Minutes of Exercise per Session:   Stress: Stress Concern Present  . Feeling of Stress : Very much  Social Connections: Socially Isolated  . Frequency of Communication with Friends and Family: More than three times a week  . Frequency of Social Gatherings with Friends and  Family: Never  . Attends Religious Services: Never  . Active Member of Clubs or Organizations: No  . Attends Archivist Meetings: Never  . Marital Status: Never married  Intimate Partner Violence: At Risk  . Fear of Current or Ex-Partner: Yes  . Emotionally Abused: No  . Physically Abused: No  . Sexually Abused: No   .  Review of Systems  Constitutional: Negative for appetite change and fatigue.  HENT: Positive for dental problem.   Gastrointestinal: Positive for abdominal pain (Generalized.  May have had black stools 3 weeks ago.  None recent.  Does not use antiimflammatory medication freqently.  No definite heartburn.  Unable to characterize pain or when occurs.). Negative for constipation, diarrhea and nausea.  Genitourinary: Negative for dysuria and vaginal discharge.  Musculoskeletal: Negative for arthralgias.  Neurological: Negative for weakness and numbness.  Psychiatric/Behavioral: Negative for dysphoric mood. The patient is not nervous/anxious.       Objective:   BP 102/70 (BP Location: Left Arm, Patient Position: Sitting, Cuff Size: Normal)   Pulse 70   Resp 12   Ht 5' (1.524 m)   Wt 149 lb (67.6 kg)   LMP 12/13/2019   BMI 29.10 kg/m   Physical Exam Constitutional:      Appearance: Normal appearance.  HENT:     Head: Normocephalic and atraumatic.     Right Ear: Hearing, tympanic membrane, ear canal and external ear normal.     Left Ear: Hearing, tympanic membrane, ear canal and external ear normal.     Nose: Nose normal.     Mouth/Throat:     Mouth: Mucous membranes are moist.     Pharynx: Oropharynx is clear.  Eyes:     Extraocular Movements: Extraocular movements intact.     Conjunctiva/sclera: Conjunctivae normal.     Pupils: Pupils are equal, round, and reactive to light.     Funduscopic exam:    Right eye: No papilledema. Red reflex present.        Left eye: No papilledema. Red reflex present. Neck:     Thyroid: No thyromegaly.    Cardiovascular:     Rate and Rhythm: Normal rate and regular rhythm.     Pulses: Normal pulses.     Heart sounds: Normal heart sounds, S1 normal and S2 normal. No S3 or S4 sounds.      Comments: No carotid bruits.  Carotid, radial, femoral, DP and PT pulses normal and equal.  Pulmonary:     Effort: Pulmonary  effort is normal.     Breath sounds: Normal breath sounds.  Chest:     Breasts:        Right: No swelling, inverted nipple, mass, nipple discharge or tenderness.        Left: No swelling, inverted nipple, mass, nipple discharge or tenderness.  Abdominal:     General: Bowel sounds are normal.     Palpations: Abdomen is soft. There is no hepatomegaly, splenomegaly or mass.     Tenderness: There is no abdominal tenderness.     Hernia: No hernia is present.  Genitourinary:    Comments: Normal external female genitalia. No cervical or vaginal mucosal lesion.  No uterine or adnexal mass or tenderness.  Musculoskeletal:        General: Normal range of motion.     Cervical back: Full passive range of motion without pain, normal range of motion and neck supple.  Lymphadenopathy:     Head:     Right side of head: No submental or submandibular adenopathy.     Left side of head: No submental or submandibular adenopathy.     Cervical: No cervical adenopathy.     Upper Body:     Right upper body: No supraclavicular or axillary adenopathy.     Left upper body: No supraclavicular or axillary adenopathy.     Lower Body: No right inguinal adenopathy. No left inguinal adenopathy.  Skin:    General: Skin is warm.     Capillary Refill: Capillary refill takes less than 2 seconds.     Findings: No rash.  Neurological:     General: No focal deficit present.     Mental Status: She is alert and oriented to person, place, and time.     Cranial Nerves: Cranial nerves are intact.     Sensory: Sensation is intact.     Motor: Motor function is intact.     Coordination: Coordination is intact.      Gait: Gait is intact.     Deep Tendon Reflexes: Reflexes are normal and symmetric.  Psychiatric:        Attention and Perception: Attention and perception normal.        Mood and Affect: Mood normal.        Speech: Speech normal.        Behavior: Behavior normal. Behavior is cooperative.        Thought Content: Thought content normal.        Cognition and Memory: Cognition normal.        Judgment: Judgment normal.      Assessment & Plan   1.  CPE with pap Influenza vaccine in fall--call beginning of Sept.  2.  Epigastric pain with reported melanotic stool 3 weeks ago.  History of anemia. No findings today CBC.  Guaiac cards x 3, to return in 1-2 weeks. Famotidine 40 mg daily Follow up in 1 month GI referral.  3.  Hyperlipidemia:  She would like more time to work on lifestyle changes with diet and physical activity.  Recheck in 4 months.  4.  Dental cavities:  Dental referral  5.  Decreased visual acuity:  Optometry referral.

## 2019-12-28 NOTE — Patient Instructions (Signed)
No mas Ibuprofen o Aleve or aspirina

## 2019-12-29 LAB — IRON AND TIBC
Iron Saturation: 7 % — CL (ref 15–55)
Iron: 32 ug/dL (ref 27–159)
Total Iron Binding Capacity: 477 ug/dL — ABNORMAL HIGH (ref 250–450)
UIBC: 445 ug/dL — ABNORMAL HIGH (ref 131–425)

## 2020-01-01 LAB — CYTOLOGY - PAP

## 2020-01-10 ENCOUNTER — Encounter: Payer: Self-pay | Admitting: Internal Medicine

## 2020-01-10 MED ORDER — FERROUS GLUCONATE 324 (38 FE) MG PO TABS: 324.0000 mg | ORAL_TABLET | Freq: Two times a day (BID) | ORAL | 3 refills | Status: DC

## 2020-01-10 NOTE — Addendum Note (Signed)
Addended by: Marcelino Duster on: 01/10/2020 12:24 PM   Modules accepted: Orders

## 2020-01-15 ENCOUNTER — Other Ambulatory Visit (INDEPENDENT_AMBULATORY_CARE_PROVIDER_SITE_OTHER): Payer: Self-pay

## 2020-01-15 DIAGNOSIS — Z1211 Encounter for screening for malignant neoplasm of colon: Secondary | ICD-10-CM

## 2020-01-15 LAB — POC HEMOCCULT BLD/STL (HOME/3-CARD/SCREEN)
Card #2 Fecal Occult Blod, POC: NEGATIVE
Card #3 Fecal Occult Blood, POC: NEGATIVE
Fecal Occult Blood, POC: NEGATIVE

## 2020-01-25 ENCOUNTER — Ambulatory Visit: Payer: Self-pay | Admitting: Internal Medicine

## 2020-01-25 ENCOUNTER — Encounter: Payer: Self-pay | Admitting: Internal Medicine

## 2020-01-25 VITALS — BP 98/62 | HR 76 | Resp 12 | Ht 60.0 in | Wt 149.0 lb

## 2020-01-25 DIAGNOSIS — R300 Dysuria: Secondary | ICD-10-CM

## 2020-01-25 DIAGNOSIS — R1084 Generalized abdominal pain: Secondary | ICD-10-CM

## 2020-01-25 DIAGNOSIS — K921 Melena: Secondary | ICD-10-CM

## 2020-01-25 DIAGNOSIS — D509 Iron deficiency anemia, unspecified: Secondary | ICD-10-CM

## 2020-01-25 LAB — POCT URINALYSIS DIPSTICK
Bilirubin, UA: NEGATIVE
Glucose, UA: NEGATIVE
Ketones, UA: NEGATIVE
Nitrite, UA: NEGATIVE
Protein, UA: NEGATIVE
Spec Grav, UA: 1.02 (ref 1.010–1.025)
Urobilinogen, UA: 0.2 E.U./dL
pH, UA: 6 (ref 5.0–8.0)

## 2020-01-25 MED ORDER — PHENAZOPYRIDINE HCL 200 MG PO TABS
200.0000 mg | ORAL_TABLET | Freq: Three times a day (TID) | ORAL | 0 refills | Status: DC | PRN
Start: 2020-01-25 — End: 2020-06-06

## 2020-01-25 MED ORDER — CIPROFLOXACIN HCL 500 MG PO TABS: ORAL_TABLET | ORAL | 0 refills | Status: DC

## 2020-01-25 NOTE — Progress Notes (Signed)
    Subjective:    Patient ID: Christine Tapia, female   DOB: 1976-03-18, 45 y.o.   MRN: [id number]   HPI   1.  Iron deficiency anemia .  She has been taking Ferrous gluconate twice daily for 8 days and tolerating fine.  Has appt to recheck CBC end of September.  She gave a history of melanotic stools 3 weeks prior to 6/24 CPE.  Guaiac cards resulted on 01/24/2020 negative for occult or frank blood.  She has been taking Famotidine 40 mg daily and feels her abdominal pain has significantly improved.  2.  Suprapubic pain with urination--urinary urge as well, for 4 days.  No fever.  No flank pain.  Mild nausea, but no vomiting.  Has been taking fluids well.   No vaginal discharge or itching. Not sexually active for past 5 months. Has been taking Azo.  Current Meds  Medication Sig  . famotidine (PEPCID) 40 MG tablet Take 1 tablet (40 mg total) by mouth daily.  . ferrous gluconate (FERGON) 324 MG tablet Take 1 tablet (324 mg total) by mouth 2 (two) times daily with a meal.   Allergies  Allergen Reactions  . Penicillins      Review of Systems    Objective:   BP (!) 98/62 (BP Location: Left Arm, Patient Position: Sitting, Cuff Size: Normal)   Pulse 76   Resp 12   Ht 5' (1.524 m)   Wt 149 lb (67.6 kg)   LMP 01/06/2020   BMI 29.10 kg/m   Physical Exam  NAD Lungs:  CTA CV:  RRR without murmur or rub.  Radial and DP pulses normal and equal Abd:  S, no tenderness, including flank tenderness save for suprapubic area.  + BS.  No HSM or mass.    Assessment & Plan   1.  Abdominal pain with report of melanotic stool and mild anemia.  Guaiac cards negative for blood.  Waiting on her to complete [facility] card for referral to GI/EGD/possible colonoscopy. Continue Famotidine and Iron.  2.  Likely acute cystitis:  Cipro 500 mg twice daily for 3 days.  Pyridium 200 mg 3 times daily for 2 days as needed. Call if no improvement.  Push fluids.

## 2020-01-27 LAB — URINE CULTURE

## 2020-03-25 ENCOUNTER — Other Ambulatory Visit: Payer: Self-pay

## 2020-03-26 ENCOUNTER — Other Ambulatory Visit: Payer: Self-pay

## 2020-03-26 DIAGNOSIS — D509 Iron deficiency anemia, unspecified: Secondary | ICD-10-CM

## 2020-03-27 LAB — CBC WITH DIFFERENTIAL/PLATELET
Basophils Absolute: 0.1 10*3/uL (ref 0.0–0.2)
Basos: 1 %
EOS (ABSOLUTE): 0.1 10*3/uL (ref 0.0–0.4)
Eos: 1 %
Hematocrit: 33.8 % — ABNORMAL LOW (ref 34.0–46.6)
Hemoglobin: 10.5 g/dL — ABNORMAL LOW (ref 11.1–15.9)
Immature Grans (Abs): 0 10*3/uL (ref 0.0–0.1)
Immature Granulocytes: 0 %
Lymphocytes Absolute: 1.9 10*3/uL (ref 0.7–3.1)
Lymphs: 27 %
MCH: 24.9 pg — ABNORMAL LOW (ref 26.6–33.0)
MCHC: 31.1 g/dL — ABNORMAL LOW (ref 31.5–35.7)
MCV: 80 fL (ref 79–97)
Monocytes Absolute: 0.5 10*3/uL (ref 0.1–0.9)
Monocytes: 7 %
Neutrophils Absolute: 4.5 10*3/uL (ref 1.4–7.0)
Neutrophils: 64 %
Platelets: 515 10*3/uL — ABNORMAL HIGH (ref 150–450)
RBC: 4.22 x10E6/uL (ref 3.77–5.28)
RDW: 17.1 % — ABNORMAL HIGH (ref 11.7–15.4)
WBC: 7.1 10*3/uL (ref 3.4–10.8)

## 2020-05-20 NOTE — Progress Notes (Signed)
Referral faxed over to stephanie to process . Patient just renewed her [facility] card 05/20/20 to 11/17/2020

## 2020-06-06 ENCOUNTER — Encounter: Payer: Self-pay | Admitting: Internal Medicine

## 2020-06-06 ENCOUNTER — Other Ambulatory Visit: Payer: Self-pay

## 2020-06-06 ENCOUNTER — Ambulatory Visit (INDEPENDENT_AMBULATORY_CARE_PROVIDER_SITE_OTHER): Payer: Self-pay | Admitting: Internal Medicine

## 2020-06-06 VITALS — BP 103/66 | HR 76 | Resp 12 | Ht 60.0 in | Wt 144.5 lb

## 2020-06-06 DIAGNOSIS — Z23 Encounter for immunization: Secondary | ICD-10-CM

## 2020-06-06 DIAGNOSIS — R1084 Generalized abdominal pain: Secondary | ICD-10-CM

## 2020-06-06 DIAGNOSIS — D509 Iron deficiency anemia, unspecified: Secondary | ICD-10-CM

## 2020-06-06 NOTE — Progress Notes (Signed)
    Subjective:    Patient ID: Christine Tapia, female   DOB: 1975/08/01, 44 y.o.   MRN: [id number]   HPI   Interpreted by Rush Barer  Iron Deficiency anemia:  Stopped taking any iron about 1 month ago as she was nauseated with it.  Was trying to take 3 times daily.   She is set finally to get a GI/endoscopic evaluation now that she renewed her [facility] card.  History in June of describing melanotic stools about 3 weeks before her visit, though stool negative for blood at time of visit. No melena or hematochezia, but is constipated.  When she eats fatty foods, she has generalized abdominal pain--not defined to epigastric or right upper quadrant. Initially, stated she was not taking Famotidine, but stated with me, she is taking the Famotidine.  Sounds like she took for a month, did not refill and then went back later to refill.  So never took continuously for more than 30 days. Ultimately, clarified she started taking again 15 days ago a developed the generalized abdominal pain again with eating fatty food.  No outpatient medications have been marked as taking for the 06/06/20 encounter (Office Visit) with Mack Hook, MD.  Later:  She is taking Famotidine 40 mg on and off.    Allergies  Allergen Reactions  . Penicillins      Review of Systems    Objective:   BP 103/66 (BP Location: Right Arm, Patient Position: Sitting, Cuff Size: Normal)   Pulse 76   Resp 12   Ht 5' (1.524 m)   Wt 144 lb 8 oz (65.5 kg)   LMP 06/06/2020   BMI 28.22 kg/m   Physical Exam  NAD Lungs: CTA CV: RRR without murmur or rub, Radial and DP pulses normal and equal Abd:  S, NT, No HSM or mass, + BS LE:  No edema.  Assessment & Plan   1.  Iron Deficiency anemia with possible history of melena in June, negative guaiac cards in June and light periods.  Has appt with GI in about 2 weeks.  She is willing to take Iron once daily and see if tolerates CBC today.  2.  HM:  Tdap  today.  Schedule for COvID vaccine clinic on Monday for booster with [employer]

## 2020-06-07 LAB — CBC WITH DIFFERENTIAL/PLATELET
Basophils Absolute: 0.1 10*3/uL (ref 0.0–0.2)
Basos: 1 %
EOS (ABSOLUTE): 0.2 10*3/uL (ref 0.0–0.4)
Eos: 2 %
Hematocrit: 33.8 % — ABNORMAL LOW (ref 34.0–46.6)
Hemoglobin: 10.5 g/dL — ABNORMAL LOW (ref 11.1–15.9)
Immature Grans (Abs): 0 10*3/uL (ref 0.0–0.1)
Immature Granulocytes: 0 %
Lymphocytes Absolute: 2 10*3/uL (ref 0.7–3.1)
Lymphs: 25 %
MCH: 24 pg — ABNORMAL LOW (ref 26.6–33.0)
MCHC: 31.1 g/dL — ABNORMAL LOW (ref 31.5–35.7)
MCV: 77 fL — ABNORMAL LOW (ref 79–97)
Monocytes Absolute: 0.7 10*3/uL (ref 0.1–0.9)
Monocytes: 8 %
Neutrophils Absolute: 5.3 10*3/uL (ref 1.4–7.0)
Neutrophils: 64 %
Platelets: 495 10*3/uL — ABNORMAL HIGH (ref 150–450)
RBC: 4.38 x10E6/uL (ref 3.77–5.28)
RDW: 17 % — ABNORMAL HIGH (ref 11.7–15.4)
WBC: 8.2 10*3/uL (ref 3.4–10.8)

## 2020-08-15 ENCOUNTER — Other Ambulatory Visit: Payer: Self-pay | Admitting: Gastroenterology

## 2020-09-26 ENCOUNTER — Other Ambulatory Visit: Payer: Self-pay

## 2020-09-27 ENCOUNTER — Other Ambulatory Visit (HOSPITAL_COMMUNITY)
Admission: RE | Admit: 2020-09-27 | Discharge: 2020-09-27 | Disposition: A | Discharge status: Home / Self Care | Payer: No Typology Code available for payment source | Source: Ambulatory Visit | Attending: Gastroenterology | Admitting: Gastroenterology

## 2020-09-27 DIAGNOSIS — Z01812 Encounter for preprocedural laboratory examination: Secondary | ICD-10-CM | POA: Insufficient documentation

## 2020-09-27 DIAGNOSIS — Z20822 Contact with and (suspected) exposure to covid-19: Secondary | ICD-10-CM | POA: Insufficient documentation

## 2020-09-27 LAB — SARS CORONAVIRUS 2 (TAT 6-24 HRS): SARS Coronavirus 2: NEGATIVE

## 2020-09-30 NOTE — H&P (Signed)
History of Present Illness  General:          She speaks [place] and we used [employer] video(#[id number], [place]), she is from [place].        45 year old female with history of hyperlipidemia, depression, neck pain presents with generalized abdominal pain and possible melena.        Patient complaining of generalized abdominal discomfort with one episode of melanotic stools. She was on famotidine daily by primary care.        Labs from 06/06/20 showed Hb 10.5, hematocrit of 33.8, seems to be persistent from even 5 years ago her hemoglobin was 10.2. MCV is 77. Platelets are 495. Iron is 32, iron saturation of 7.        On EPIC, FOBT from 7/21 was negative X 3.        She was referred for stomach aches, worse with eating. Pain is described as generalized, lasts for 15-20 minutes, is non radiating, not daily, it is intermittent, it has been ongoing for the past 1 year.        Denies weight loss or loss of appetite.        Denies nausea, vomiting, she has occasional acid reflux and heartburn, she has not needed medicaitons, denies difficulty or pain on swallowing.        No prior EGD or colonoscopy.        There is no family history of colon cancer.        Her stools are described as diarrhea when she has stomach pain but majority of the time stools are brown, she has noted black stools(6 months ago, she had black stools for a week), denies blood in stool.   Surgical History No Surgical History documented.       Family History  Father: deceased  Mother: deceased    Social History  General:   Tobacco use       cigarettes:  Never smoked     Tobacco history last updated  06/26/2020     Vaping  No no EXPOSURE TO PASSIVE SMOKE.  Alcohol: no.  Caffeine: yes, coffee, very little, soda, occasionally.  DIET: no particular dietary program, just tries to stay awayfrom fatty foods.      Allergies  Penicillin: rash/itchy - Allergy - Criticality High     Hospitalization/Major Diagnostic  Procedure not in the past year 06/26/2020   Review of Systems  GI PROCEDURE:          Pacemaker/ AICD no.  Artificial heart valves no.  MI/heart attack no.  Abnormal heart rhythm no.  Angina no.  CVA no.  Hypertension no.  Hypotension no.  Asthma, COPD no.  Sleep apnea no.  Seizure disorders no.  Artificial joints no.  Severe DJD no.  Diabetes no.  Significant headaches no.  Vertigo no.  Depression/anxiety no.  Abnormal bleeding YES.  Kidney Disease no.  Liver disease no.  Chance of pregnancy no.  Blood transfusion no.  Method of Birth Control Nothing.  Birth control pills no.    Vital Signs  Wt 144.6, Ht 61, BMI 27.32, Temp 97.5, Pulse sitting 67, BP sitting 118/61, Oxygen sat % 96.    Examination  Gastroenterology::        GENERAL APPEARANCE: Well developed, well nourished, no active distress, pleasant.         SCLERA: anicteric.         CARDIOVASCULAR Normal RRR .         RESPIRATORY Breath sounds  normal. Respiration even and unlabored.         ABDOMEN No masses palpated. Liver and spleen not palpated, normal. Bowel sounds normal, Abdomen not distended.         EXTREMITIES: No edema.         NEURO: alert, oriented to time, place and person, normal gait.         PSYCH: mood/affect normal.       Assessments   1. Iron deficiency anemia, unspecified iron deficiency anemia type - D50.9 (Primary)  2. Screen for colon cancer - Z12.11  3. Black stools - K92.1  4. Generalized abdominal pain - R10.84     Treatment   1. Iron deficiency anemia, unspecified iron deficiency anemia type         IMAGING: Esophagoscopy Notes: Recommend diagnostic endoscopy with biopsies to evaluate for H. pylori from antrum and biopsies for celiac disease from small bowel.      2. Screen for colon cancer         IMAGING: Colonoscopy Notes: The risks and benefits of the procedure were discussed with the patient in details. She understands and verbalizes consent. She will be given written instructions,  prescription for preparation and will be scheduled for the same.      3. Black stools         IMAGING: Esophagoscopy Notes: She is anemic although prior FOBT times 3 were negative in 7/21. Will proceed with a diagnostic endoscopy.       4. Generalized abdominal pain         IMAGING: Esophagoscopy Notes: Unclear etiology, schedule patient for endoscopy and colonoscopy which are indicated for other symptoms.

## 2020-10-01 ENCOUNTER — Other Ambulatory Visit: Payer: Self-pay

## 2020-10-01 ENCOUNTER — Ambulatory Visit (HOSPITAL_COMMUNITY): Payer: No Typology Code available for payment source | Admitting: Certified Registered Nurse Anesthetist

## 2020-10-01 ENCOUNTER — Encounter (HOSPITAL_COMMUNITY): Payer: Self-pay | Admitting: Gastroenterology

## 2020-10-01 ENCOUNTER — Ambulatory Visit (HOSPITAL_COMMUNITY)
Admission: RE | Admit: 2020-10-01 | Discharge: 2020-10-01 | Disposition: A | Discharge status: Home / Self Care | Payer: No Typology Code available for payment source | Attending: Gastroenterology | Admitting: Gastroenterology

## 2020-10-01 ENCOUNTER — Encounter (HOSPITAL_COMMUNITY)
Admission: RE | Disposition: A | Discharge status: Home / Self Care | Payer: Self-pay | Source: Home / Self Care | Attending: Gastroenterology

## 2020-10-01 DIAGNOSIS — K222 Esophageal obstruction: Secondary | ICD-10-CM | POA: Insufficient documentation

## 2020-10-01 DIAGNOSIS — E785 Hyperlipidemia, unspecified: Secondary | ICD-10-CM | POA: Insufficient documentation

## 2020-10-01 DIAGNOSIS — Z880 Allergy status to penicillin: Secondary | ICD-10-CM | POA: Insufficient documentation

## 2020-10-01 DIAGNOSIS — K2950 Unspecified chronic gastritis without bleeding: Secondary | ICD-10-CM | POA: Insufficient documentation

## 2020-10-01 DIAGNOSIS — R197 Diarrhea, unspecified: Secondary | ICD-10-CM | POA: Insufficient documentation

## 2020-10-01 DIAGNOSIS — K449 Diaphragmatic hernia without obstruction or gangrene: Secondary | ICD-10-CM | POA: Insufficient documentation

## 2020-10-01 DIAGNOSIS — D509 Iron deficiency anemia, unspecified: Secondary | ICD-10-CM | POA: Insufficient documentation

## 2020-10-01 DIAGNOSIS — K3189 Other diseases of stomach and duodenum: Secondary | ICD-10-CM | POA: Insufficient documentation

## 2020-10-01 DIAGNOSIS — K5730 Diverticulosis of large intestine without perforation or abscess without bleeding: Secondary | ICD-10-CM | POA: Insufficient documentation

## 2020-10-01 DIAGNOSIS — D120 Benign neoplasm of cecum: Secondary | ICD-10-CM | POA: Insufficient documentation

## 2020-10-01 HISTORY — PX: BIOPSY: SHX5522

## 2020-10-01 HISTORY — PX: POLYPECTOMY: SHX5525

## 2020-10-01 HISTORY — PX: HEMOSTASIS CLIP PLACEMENT: SHX6857

## 2020-10-01 HISTORY — PX: ESOPHAGOGASTRODUODENOSCOPY (EGD) WITH PROPOFOL: SHX5813

## 2020-10-01 HISTORY — PX: COLONOSCOPY WITH PROPOFOL: SHX5780

## 2020-10-01 SURGERY — COLONOSCOPY WITH PROPOFOL
Anesthesia: Monitor Anesthesia Care

## 2020-10-01 MED ORDER — LACTATED RINGERS IV SOLN: INTRAVENOUS | Status: DC

## 2020-10-01 MED ORDER — LIDOCAINE 2% (20 MG/ML) 5 ML SYRINGE
INTRAMUSCULAR | Status: DC | PRN
  Administered 2020-10-01: 60 mg via INTRAVENOUS

## 2020-10-01 MED ORDER — PROPOFOL 10 MG/ML IV BOLUS
INTRAVENOUS | Status: DC | PRN
  Administered 2020-10-01 (×2): 40 mg via INTRAVENOUS
  Administered 2020-10-01: 20 mg via INTRAVENOUS

## 2020-10-01 MED ORDER — PROPOFOL 500 MG/50ML IV EMUL
INTRAVENOUS | Status: DC | PRN
  Administered 2020-10-01: 150 ug/kg/min via INTRAVENOUS

## 2020-10-01 SURGICAL SUPPLY — 24 items

## 2020-10-01 NOTE — Anesthesia Preprocedure Evaluation (Addendum)
Anesthesia Evaluation  Patient identified by MRN, date of birth, ID band Patient awake    Reviewed: Allergy & Precautions, NPO status , Patient's Chart, lab work & pertinent test results  Airway Mallampati: II  TM Distance: >3 FB Neck ROM: Full    Dental  (+) Partial Upper   Pulmonary neg pulmonary ROS,    Pulmonary exam normal breath sounds clear to auscultation       Cardiovascular negative cardio ROS Normal cardiovascular exam Rhythm:Regular Rate:Normal     Neuro/Psych PSYCHIATRIC DISORDERS Depression negative neurological ROS     GI/Hepatic negative GI ROS, Neg liver ROS, Bowel prep,  Endo/Other  negative endocrine ROS  Renal/GU negative Renal ROS     Musculoskeletal negative musculoskeletal ROS (+)   Abdominal   Peds  Hematology HLD   Anesthesia Other Findings Anemia, Screening, Black stools, Abd pain  Reproductive/Obstetrics                            Anesthesia Physical Anesthesia Plan  ASA: I  Anesthesia Plan: MAC   Post-op Pain Management:    Induction: Intravenous  PONV Risk Score and Plan: 2 and Propofol infusion and Treatment may vary due to age or medical condition  Airway Management Planned: Simple Face Mask  Additional Equipment:   Intra-op Plan:   Post-operative Plan:   Informed Consent: I have reviewed the patients History and Physical, chart, labs and discussed the procedure including the risks, benefits and alternatives for the proposed anesthesia with the patient or authorized representative who has indicated his/her understanding and acceptance.     Dental advisory given and Interpreter used for interveiw  Plan Discussed with: CRNA  Anesthesia Plan Comments:        Anesthesia Quick Evaluation

## 2020-10-01 NOTE — Interval H&P Note (Signed)
History and Physical Interval Note: 44/female with generalized abdominal pain, dark stools, iron deficiency anemia for an EGD and screening colonoscopy.  10/01/2020 9:17 AM  [name]  has presented today for EGD and colonoscopy, with the diagnosis of Anemia, Screening, Black stools, Abd pain.  The various methods of treatment have been discussed with the patient and family. After consideration of risks, benefits and other options for treatment, the patient has consented to  Procedure(s): COLONOSCOPY WITH PROPOFOL (N/A) ESOPHAGOGASTRODUODENOSCOPY (EGD) WITH PROPOFOL (N/A) as a surgical intervention.  The patient's history has been reviewed, patient examined, no change in status, stable for surgery.  I have reviewed the patient's chart and labs.  Questions were answered to the patient's satisfaction.     Christine Tapia

## 2020-10-01 NOTE — Discharge Instructions (Signed)

## 2020-10-01 NOTE — Transfer of Care (Signed)
Immediate Anesthesia Transfer of Care Note  Patient: Christine Tapia  Procedure(s) Performed: COLONOSCOPY WITH PROPOFOL (N/A ) ESOPHAGOGASTRODUODENOSCOPY (EGD) WITH PROPOFOL (N/A )  Patient Location: PACU and Endoscopy Unit  Anesthesia Type:MAC  Level of Consciousness: awake, alert  and oriented  Airway & Oxygen Therapy: Patient Spontanous Breathing  Post-op Assessment: Report given to RN and Post -op Vital signs reviewed and stable  Post vital signs: Reviewed and stable  Last Vitals:  Vitals Value Taken Time  BP    Temp    Pulse 60 10/01/20 1052  Resp 20 10/01/20 1052  SpO2 100 % 10/01/20 1052  Vitals shown include unvalidated device data.  Last Pain:  Vitals:   10/01/20 0915  TempSrc: Oral  PainSc: 0-No pain         Complications: No complications documented.

## 2020-10-01 NOTE — Brief Op Note (Signed)
10/01/2020  10:54 AM  PATIENT:  Christine Tapia  45 y.o. female  PRE-OPERATIVE DIAGNOSIS:  Anemia, Screening, Black stools, Abd pain  POST-OPERATIVE DIAGNOSIS:  gastritis scharski ring diverticulosis cecum polyp  PROCEDURE:  Procedure(s): COLONOSCOPY WITH PROPOFOL (N/A) ESOPHAGOGASTRODUODENOSCOPY (EGD) WITH PROPOFOL (N/A)  SURGEON:  Surgeon(s) and Role:    Ronnette Juniper, MD - Primary  PHYSICIAN ASSISTANT:   ASSISTANTS: Elease Hashimoto Holloway,Tech   ANESTHESIA:   MAC  EBL:  Minimal  BLOOD ADMINISTERED:none  DRAINS: none   LOCAL MEDICATIONS USED:  NONE  SPECIMEN:  Biopsy / Limited Resection  DISPOSITION OF SPECIMEN:  PATHOLOGY  COUNTS:  YES  TOURNIQUET:  * No tourniquets in log *  DICTATION: .Dragon Dictation  PLAN OF CARE: Discharge to home after PACU  PATIENT DISPOSITION:  PACU - hemodynamically stable.   Delay start of Pharmacological VTE agent (>24hrs) due to surgical blood loss or risk of bleeding: not applicable

## 2020-10-01 NOTE — Op Note (Signed)
[facility] Patient Name: Christine Tapia Procedure Date: 10/01/2020 MRN: [id number] Attending MD: Ronnette Juniper , MD Date of Birth: Mar 19, 1976 CSN: [id number] Age: 45 Admit Type: Outpatient Procedure:                Upper GI endoscopy Indications:              Generalized abdominal pain, Unexplained iron                            deficiency anemia, intermittent dark stools Providers:                Ronnette Juniper, MD, Cleda Daub, RN, Tyna Jaksch                            Technician Referring MD:             Verita Schneiders Medicines:                Monitored Anesthesia Care Complications:            No immediate complications. Estimated blood loss:                            Minimal. Estimated Blood Loss:     Estimated blood loss was minimal. Procedure:                Pre-Anesthesia Assessment:                           - Prior to the procedure, a History and Physical                            was performed, and patient medications and                            allergies were reviewed. The patient's tolerance of                            previous anesthesia was also reviewed. The risks                            and benefits of the procedure and the sedation                            options and risks were discussed with the patient.                            All questions were answered, and informed consent                            was obtained. Prior Anticoagulants: The patient has                            taken no previous anticoagulant or antiplatelet                            agents. ASA Grade  Assessment: II - A patient with                            mild systemic disease. After reviewing the risks                            and benefits, the patient was deemed in                            satisfactory condition to undergo the procedure.                           After obtaining informed consent, the endoscope was                            passed  under direct vision. Throughout the                            procedure, the patient's blood pressure, pulse, and                            oxygen saturations were monitored continuously. The                            GIF-H190 ([id number] Olympus gastroscope was                            introduced through the mouth, and advanced to the                            second part of duodenum. The upper GI endoscopy was                            accomplished without difficulty. The patient                            tolerated the procedure well. Scope In: Scope Out: Findings:      A widely patent Schatzki ring was found at the gastroesophageal junction.      A 2 cm hiatal hernia was present.      Diffuse mildly erythematous mucosa without bleeding was found in the       gastric fundus, in the gastric body and in the gastric antrum. Biopsies       were taken with a cold forceps for Helicobacter pylori testing.      The cardia and gastric fundus were otherwise normal on retroflexion.      The examined duodenum was normal. Biopsies for histology were taken with       a cold forceps for evaluation of celiac disease. Impression:               - Widely patent Schatzki ring.                           - 2 cm hiatal hernia.                           -  Erythematous mucosa in the gastric fundus,                            gastric body and antrum. Biopsied.                           - Normal examined duodenum. Biopsied. Moderate Sedation:      Patient did not receive moderate sedation for this procedure, but       instead received monitored anesthesia care. Recommendation:           - Patient has a contact number available for                            emergencies. The signs and symptoms of potential                            delayed complications were discussed with the                            patient. Return to normal activities tomorrow.                            Written discharge  instructions were provided to the                            patient.                           - Resume regular diet.                           - Continue present medications.                           - Await pathology results. Procedure Code(s):        --- Professional ---                           [phone], Esophagogastroduodenoscopy, flexible,                            transoral; with biopsy, single or multiple Diagnosis Code(s):        --- Professional ---                           K22.2, Esophageal obstruction                           K44.9, Diaphragmatic hernia without obstruction or                            gangrene                           K31.89, Other diseases of stomach and duodenum  R10.84, Generalized abdominal pain                           D50.9, Iron deficiency anemia, unspecified CPT copyright 2019 American Medical Association. All rights reserved. The codes documented in this report are preliminary and upon coder review may  be revised to meet current compliance requirements. Ronnette Juniper, MD 10/01/2020 10:50:00 AM This report has been signed electronically. Number of Addenda: 0

## 2020-10-01 NOTE — Op Note (Signed)
[facility] Patient Name: Christine Tapia Procedure Date: 10/01/2020 MRN: [id number] Attending MD: Ronnette Juniper , MD Date of Birth: [birth year] CSN: [id number] Age: 45 Admit Type: Outpatient Procedure:                Colonoscopy Indications:              This is the patient's first colonoscopy,                            Generalized abdominal pain, Unexplained iron                            deficiency anemia Providers:                Ronnette Juniper, MD, Cleda Daub, RN, Tyna Jaksch                            Technician Referring MD:             Verita Schneiders Medicines:                Monitored Anesthesia Care Complications:            No immediate complications. Estimated blood loss:                            Minimal. Estimated Blood Loss:     Estimated blood loss was minimal. Procedure:                Pre-Anesthesia Assessment:                           - Prior to the procedure, a History and Physical                            was performed, and patient medications and                            allergies were reviewed. The patient's tolerance of                            previous anesthesia was also reviewed. The risks                            and benefits of the procedure and the sedation                            options and risks were discussed with the patient.                            All questions were answered, and informed consent                            was obtained. Prior Anticoagulants: The patient has                            taken no previous anticoagulant or antiplatelet  agents. ASA Grade Assessment: II - A patient with                            mild systemic disease. After reviewing the risks                            and benefits, the patient was deemed in                            satisfactory condition to undergo the procedure.                           - Prior to the procedure, a History and Physical                             was performed, and patient medications and                            allergies were reviewed. The patient's tolerance of                            previous anesthesia was also reviewed. The risks                            and benefits of the procedure and the sedation                            options and risks were discussed with the patient.                            All questions were answered, and informed consent                            was obtained. Prior Anticoagulants: The patient has                            taken no previous anticoagulant or antiplatelet                            agents. ASA Grade Assessment: II - A patient with                            mild systemic disease. After reviewing the risks                            and benefits, the patient was deemed in                            satisfactory condition to undergo the procedure.                           After obtaining informed consent, the colonoscope  was passed under direct vision. Throughout the                            procedure, the patient's blood pressure, pulse, and                            oxygen saturations were monitored continuously. The                            PCF-H190DL ([id number] Olympus pediatric colonscope                            was introduced through the anus and advanced to the                            the terminal ileum. The colonoscopy was performed                            without difficulty. The patient tolerated the                            procedure well. The quality of the bowel                            preparation was good. Scope In: 10:22:52 AM Scope Out: 10:43:46 AM Total Procedure Duration: 0 hours 20 minutes 54 seconds  Findings:      The perianal and digital rectal examinations were normal.      Two sessile polyps were found in the cecum. The polyps were 4 to 15 mm       in size. The smaller polyp was  removed with a cold biopsy forceps while       the larger polyp was removed with a hot snare. Resection and retrieval       were complete. To close a defect after polypectomy, one hemostatic clip       was successfully placed (MR conditional) at the site of large polyp       removal. There was no bleeding during, or at the end, of the procedure.      The terminal ileum appeared normal.      A few small and large-mouthed diverticula were found in the hepatic       flexure and ascending colon.      The exam was otherwise without abnormality on direct and retroflexion       views. Impression:               - Two 4 to 15 mm polyps in the cecum, removed with                            a hot snare. Resected and retrieved. Clip (MR                            conditional) was placed.                           - The examined portion of the ileum was  normal.                           - Diverticulosis at the hepatic flexure and in the                            ascending colon.                           - The examination was otherwise normal on direct                            and retroflexion views. Moderate Sedation:      Patient did not receive moderate sedation for this procedure, but       instead received monitored anesthesia care. Recommendation:           - Patient has a contact number available for                            emergencies. The signs and symptoms of potential                            delayed complications were discussed with the                            patient. Return to normal activities tomorrow.                            Written discharge instructions were provided to the                            patient.                           - High fiber diet.                           - Continue present medications.                           - Await pathology results.                           - Repeat colonoscopy for surveillance based on                             pathology results. Procedure Code(s):        --- Professional ---                           [phone], Colonoscopy, flexible; with removal of                            tumor(s), polyp(s), or other lesion(s) by snare                            technique Diagnosis Code(s):        ---  Professional ---                           K63.5, Polyp of colon                           R10.84, Generalized abdominal pain                           D50.9, Iron deficiency anemia, unspecified                           K57.30, Diverticulosis of large intestine without                            perforation or abscess without bleeding CPT copyright 2019 American Medical Association. All rights reserved. The codes documented in this report are preliminary and upon coder review may  be revised to meet current compliance requirements. Ronnette Juniper, MD 10/01/2020 10:54:05 AM This report has been signed electronically. Number of Addenda: 0

## 2020-10-02 ENCOUNTER — Encounter (HOSPITAL_COMMUNITY): Payer: Self-pay | Admitting: Gastroenterology

## 2020-10-02 LAB — SURGICAL PATHOLOGY

## 2020-10-02 NOTE — Anesthesia Postprocedure Evaluation (Signed)
Anesthesia Post Note  Patient: Christine Tapia  Procedure(s) Performed: COLONOSCOPY WITH PROPOFOL (N/A ) ESOPHAGOGASTRODUODENOSCOPY (EGD) WITH PROPOFOL (N/A ) POLYPECTOMY HEMOSTASIS CLIP PLACEMENT     Patient location during evaluation: Endoscopy Anesthesia Type: MAC Level of consciousness: awake Pain management: pain level controlled Vital Signs Assessment: post-procedure vital signs reviewed and stable Respiratory status: spontaneous breathing, nonlabored ventilation, respiratory function stable and patient connected to nasal cannula oxygen Cardiovascular status: stable and blood pressure returned to baseline Postop Assessment: no apparent nausea or vomiting Anesthetic complications: no   No complications documented.  Last Vitals:  Vitals:   10/01/20 1100 10/01/20 1110  BP: 108/74 111/80  Pulse: 74 (!) 58  Resp: 17 19  Temp:    SpO2: 100% 100%    Last Pain:  Vitals:   10/01/20 1110  TempSrc:   PainSc: 0-No pain                 Auriah Hollings P Rebel Willcutt

## 2020-10-03 ENCOUNTER — Encounter: Payer: Self-pay | Admitting: Internal Medicine

## 2020-10-03 DIAGNOSIS — K2970 Gastritis, unspecified, without bleeding: Secondary | ICD-10-CM

## 2020-10-03 DIAGNOSIS — D120 Benign neoplasm of cecum: Secondary | ICD-10-CM | POA: Insufficient documentation

## 2020-10-03 DIAGNOSIS — B9681 Helicobacter pylori [H. pylori] as the cause of diseases classified elsewhere: Secondary | ICD-10-CM

## 2020-10-03 HISTORY — DX: Benign neoplasm of cecum: D12.0

## 2020-10-03 HISTORY — DX: Gastritis, unspecified, without bleeding: K29.70

## 2020-10-03 HISTORY — DX: Helicobacter pylori (H. pylori) as the cause of diseases classified elsewhere: B96.81

## 2020-10-04 ENCOUNTER — Telehealth: Payer: Self-pay | Admitting: Internal Medicine

## 2020-10-04 NOTE — Telephone Encounter (Signed)
Patient called asking for assistance interpreting for her at the Gastroenterology. Patient received the message that she was prescribed Amoxicilline and she is allergic to it.   I call the Gastroenterology office [phone], and let them know the patient's message. Front desk will communicate the message to the Doctor.  Patient also wanted to make you aware of this situation.

## 2020-10-07 NOTE — Telephone Encounter (Signed)
Could you call to confirm a substitution?  Thanks for notifying GI!

## 2020-10-10 NOTE — Telephone Encounter (Signed)
Called patient - no answer and no voicemail available.

## 2020-10-17 NOTE — Telephone Encounter (Signed)
Called patient. Phone is currently not accepting calls or voice messages.

## 2020-10-17 NOTE — Telephone Encounter (Signed)
Called patient, phone is not receiving calls at this moment and no available voicemail.

## 2020-11-04 NOTE — Telephone Encounter (Signed)
Spoke with patient and she stated that her medicine was changed by the GI. Patient is feeling much better.

## 2021-01-02 ENCOUNTER — Encounter: Payer: Self-pay | Admitting: Internal Medicine

## 2021-01-02 ENCOUNTER — Other Ambulatory Visit: Payer: Self-pay

## 2021-01-02 ENCOUNTER — Ambulatory Visit: Payer: Self-pay | Admitting: Internal Medicine

## 2021-01-02 VITALS — BP 104/78 | HR 64 | Resp 16 | Ht 61.0 in | Wt 148.0 lb

## 2021-01-02 DIAGNOSIS — N920 Excessive and frequent menstruation with regular cycle: Secondary | ICD-10-CM

## 2021-01-02 DIAGNOSIS — D509 Iron deficiency anemia, unspecified: Secondary | ICD-10-CM

## 2021-01-02 DIAGNOSIS — Z1231 Encounter for screening mammogram for malignant neoplasm of breast: Secondary | ICD-10-CM

## 2021-01-02 DIAGNOSIS — E782 Mixed hyperlipidemia: Secondary | ICD-10-CM

## 2021-01-02 DIAGNOSIS — Z0000 Encounter for general adult medical examination without abnormal findings: Secondary | ICD-10-CM

## 2021-01-02 DIAGNOSIS — E041 Nontoxic single thyroid nodule: Secondary | ICD-10-CM

## 2021-01-02 NOTE — Progress Notes (Signed)
Subjective:    Patient ID: Christine Tapia, female   DOB: 03-Jan-1976, 45 y.o.   MRN: [id number]   HPI  Christine Tapia interprets  CPE without pap  1.  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    2.  Mammogram:  Last performed 11/2019 and normal.  She has not scheduled again after receiving her letter beginning of June.  No family history of breast cancer.  3.  Osteoprevention:  Does drink some almond milk and cow's milk, but only once daily.  Walks, cycles and uses weight equipment 2-3 times weekly at a gym.  Does other exercises daily on alternate days.  No family history of osteoporosis.  4.  Guaiac Cards:  See below  5.  Colonoscopy:  Performed 09/2020 with Dr. 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  She believes her next colonoscopy is in 3 years.  No family history of colon cancer.  6.  Immunizations:  Has not had Pump Back booster yet.  Second vaccine with body aches and low grade fever.    Immunization History  Administered Date(s) Administered   Influenza Inj Mdck Quad Pf 06/04/2016   PFIZER(Purple Top)SARS-COV-2 Vaccination 10/26/2019, 11/22/2019   Tdap 06/06/2020     7.  Glucose/Cholesterol:  Blood glucose has always been finer. History of elevated cholesterol.  Quite high last year at this time. Lipid Panel     Component Value Date/Time   CHOL 266 (H) 12/22/2019 0907   TRIG 195 (H) 12/22/2019 0907   HDL 58 12/22/2019 0907   LDLCALC 172 (H) 12/22/2019 0907   LABVLDL 36 12/22/2019 0907     Current Meds  Medication Sig   ferrous gluconate (FERGON) 324 MG tablet Take 1 tablet (324 mg total) by mouth 2 (two) times daily with a meal.   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 [facility]   Depression    History of Reactive depression:  Her older son, [place], was kidnapped [place].  She had to pay ransom.  He was then shot in the head in [place] and had a long convalescence.   Currently also in a concerning relationship.   Helicobacter pylori gastritis 10/03/2020   Dr. Therisa Doyne, [facility]   Hyperlipidemia 02/2015   Elevated trigs and LDL   Neck pain 2016   Past Surgical History:  Procedure Laterality Date   BIOPSY  10/01/2020   Procedure: BIOPSY;  Surgeon: Ronnette Juniper, MD;  Location: [facility];  Service: Gastroenterology;;   CERVICAL BIOPSY  W/ LOOP ELECTRODE EXCISION  04/15/2015   Dr Roselie Awkward   COLONOSCOPY WITH PROPOFOL N/A 10/01/2020   Procedure: COLONOSCOPY WITH PROPOFOL;  Surgeon: Ronnette Juniper, MD;  Location: [facility];  Service: Gastroenterology;  Laterality: N/A;   COLPOSCOPY     ESOPHAGOGASTRODUODENOSCOPY (EGD) WITH PROPOFOL N/A 10/01/2020   Procedure: ESOPHAGOGASTRODUODENOSCOPY (EGD) WITH PROPOFOL;  Surgeon: Ronnette Juniper, MD;  Location: [facility];  Service: Gastroenterology;  Laterality: N/A;   HEMOSTASIS CLIP PLACEMENT  10/01/2020   Procedure: HEMOSTASIS CLIP PLACEMENT;  Surgeon: Ronnette Juniper, MD;  Location: [facility];  Service: Gastroenterology;;   POLYPECTOMY  10/01/2020   Procedure: POLYPECTOMY;  Surgeon: Therisa Doyne,  Megan Salon, MD;  Location: [facility];  Service: Gastroenterology;;   TUBAL LIGATION     Family History  Problem Relation Age of Onset   Cancer Mother 44       Uterine   Hypertension Mother    Hypertension Sister    Depression Daughter    Breast cancer Neg Hx    Social History   Socioeconomic History   Marital status: Single    Spouse name: Not on file   Number of children: 4   Years of education: college   Highest education level: Some college, no degree  Occupational History   Occupation: Higher education careers adviser  Tobacco Use   Smoking status: Never   Smokeless tobacco: Never  Vaping Use   Vaping Use: Never used   Substance and Sexual Activity   Alcohol use: Yes    Comment: Rare-1 beer twice yearly   Drug use: No   Sexual activity: Yes    Birth control/protection: Surgical  Other Topics Concern   Not on file  Social History Narrative   Originally from [place]   Came to [place] In 2015   Lives with sons, [place]ifton [place] and [place]   All children live in [place]., 3 in [place] and 1 in [place].   Social Determinants of Health   Financial Resource Strain: Low Risk    Difficulty of Paying Living Expenses: Not hard at all  Food Insecurity: No Food Insecurity   Worried About Charity fundraiser in the Last Year: Never true   Arboriculturist in the Last Year: Never true  Transportation Needs: Not on file  Physical Activity: Not on file  Stress: Not on file  Social Connections: Not on file  Intimate Partner Violence: Not At Risk   Fear of Current or Ex-Partner: No   Emotionally Abused: No   Physically Abused: No   Sexually Abused: No     Review of Systems  Respiratory:  Negative for shortness of breath.   Cardiovascular:  Negative for chest pain, palpitations and leg swelling.  Gastrointestinal:  Negative for blood in stool (No melena).  Genitourinary:        Last month, May, she had her period at the usual time, lasting 3 days.  Two days later, started with lighter flow again, lasting 20 days.  States she used smaller pads for the latter 20 days of flow and only needed to change 3 times daily whereas with a regular period lasting 4-5 days, she often requires larger pads and to change 4 times daily.  No increased discomfort with the 20 days of flow. No vaginal discharge.   This has never happened before.  Hard time sleeping, but no hot flashes or night sweats.     Objective:   BP 104/78 (BP Location: Right Arm, Patient Position: Sitting, Cuff Size: Normal)   Pulse 64   Resp 16   Ht 5\' 1"  (1.549 m)   Wt 148 lb (67.1 kg)   LMP 01/02/2021   BMI 27.96 kg/m   Physical Exam HENT:     Head:  Normocephalic and atraumatic.     Right Ear: Tympanic membrane, ear canal and external ear normal.     Left Ear: Tympanic membrane, ear canal and external ear normal.     Nose: Nose normal.     Mouth/Throat:     Mouth: Mucous membranes are moist.     Pharynx: Oropharynx is clear.  Eyes:     Extraocular Movements: Extraocular movements intact.  Conjunctiva/sclera: Conjunctivae normal.     Pupils: Pupils are equal, round, and reactive to light.     Comments: Discs sharp bilaterally  Neck:     Thyroid: Thyromegaly (Nodular) present.  Cardiovascular:     Rate and Rhythm: Normal rate and regular rhythm.     Heart sounds: S1 normal and S2 normal. No murmur heard.   No friction rub. No S3 or S4 sounds.     Comments: No carotid bruits.  Carotid, radial, femoral, DP and PT pulses normal and equal.    Pulmonary:     Effort: Pulmonary effort is normal.     Breath sounds: Normal breath sounds.  Chest:  Breasts:    Right: No swelling, inverted nipple, mass or nipple discharge.     Left: No swelling, inverted nipple, mass or nipple discharge.  Abdominal:     General: Bowel sounds are normal.     Palpations: Abdomen is soft. There is no hepatomegaly, splenomegaly or mass.     Tenderness: There is no abdominal tenderness.     Hernia: No hernia is present.  Genitourinary:    Comments: Uterus retroverted and small, NT. No adnexal mass or tenderness. Dark to bright red blood in vaginal canal.  No vaginal or cervical lesion noted. No vaginal discharge  Musculoskeletal:        General: Normal range of motion.     Cervical back: Normal range of motion and neck supple.     Right lower leg: No edema.     Left lower leg: No edema.  Lymphadenopathy:     Head:     Right side of head: No submental or submandibular adenopathy.     Left side of head: No submental or submandibular adenopathy.     Cervical: No cervical adenopathy.     Upper Body:     Right upper body: No supraclavicular or  axillary adenopathy.     Left upper body: No supraclavicular or axillary adenopathy.     Lower Body: No right inguinal adenopathy. No left inguinal adenopathy.  Skin:    General: Skin is warm.     Capillary Refill: Capillary refill takes less than 2 seconds.     Findings: No rash.  Neurological:     General: No focal deficit present.     Mental Status: She is alert and oriented to person, place, and time.     Cranial Nerves: Cranial nerves 2-12 are intact.     Sensory: Sensation is intact.     Motor: Motor function is intact.     Coordination: Coordination is intact.     Gait: Gait is intact.     Deep Tendon Reflexes: Reflexes are normal and symmetric.  Psychiatric:        Attention and Perception: Attention normal.        Mood and Affect: Mood normal.        Speech: Speech normal.        Behavior: Behavior normal. Behavior is cooperative.   Ueterus retroverted and small, NT Dark to bright red blood in vaginal canal.  No lesion noted or discharge  Assessment & Plan   CPE without pap Mammogram Repeat colonoscopy in 3 years with Dr. Therisa Doyne with history of cecal adenoma 09/2020. CBC, CMP  2.  Anemia:  possible due to menstrual blood loss, though menorrhagia is a new complaint.  EGD and colonoscopy without obvious source of blood loss.  3.  Hyperlipidemia:  FLP  4.  Nodular thyroid:  TSH  and thyroid ultrasound, though needs orange card first.  5.  Menorrhagia:  CBC.  No findings on exam.  To call if continues with future cycles

## 2021-01-03 LAB — TSH: TSH: 2.02 u[IU]/mL (ref 0.450–4.500)

## 2021-01-03 LAB — CBC WITH DIFFERENTIAL/PLATELET
Basophils Absolute: 0.1 10*3/uL (ref 0.0–0.2)
Basos: 1 %
EOS (ABSOLUTE): 0.1 10*3/uL (ref 0.0–0.4)
Eos: 2 %
Hematocrit: 35.1 % (ref 34.0–46.6)
Hemoglobin: 11.4 g/dL (ref 11.1–15.9)
Immature Grans (Abs): 0 10*3/uL (ref 0.0–0.1)
Immature Granulocytes: 0 %
Lymphocytes Absolute: 1.9 10*3/uL (ref 0.7–3.1)
Lymphs: 29 %
MCH: 27.3 pg (ref 26.6–33.0)
MCHC: 32.5 g/dL (ref 31.5–35.7)
MCV: 84 fL (ref 79–97)
Monocytes Absolute: 0.5 10*3/uL (ref 0.1–0.9)
Monocytes: 7 %
Neutrophils Absolute: 3.9 10*3/uL (ref 1.4–7.0)
Neutrophils: 61 %
Platelets: 327 10*3/uL (ref 150–450)
RBC: 4.17 x10E6/uL (ref 3.77–5.28)
RDW: 16.7 % — ABNORMAL HIGH (ref 11.7–15.4)
WBC: 6.5 10*3/uL (ref 3.4–10.8)

## 2021-01-03 LAB — COMPREHENSIVE METABOLIC PANEL
ALT: 25 IU/L (ref 0–32)
AST: 25 IU/L (ref 0–40)
Albumin/Globulin Ratio: 2 (ref 1.2–2.2)
Albumin: 4.7 g/dL (ref 3.8–4.8)
Alkaline Phosphatase: 85 IU/L (ref 44–121)
BUN/Creatinine Ratio: 25 — ABNORMAL HIGH (ref 9–23)
BUN: 14 mg/dL (ref 6–24)
Bilirubin Total: 0.2 mg/dL (ref 0.0–1.2)
CO2: 20 mmol/L (ref 20–29)
Calcium: 8.8 mg/dL (ref 8.7–10.2)
Chloride: 103 mmol/L (ref 96–106)
Creatinine, Ser: 0.57 mg/dL (ref 0.57–1.00)
Globulin, Total: 2.3 g/dL (ref 1.5–4.5)
Glucose: 82 mg/dL (ref 65–99)
Potassium: 3.9 mmol/L (ref 3.5–5.2)
Sodium: 138 mmol/L (ref 134–144)
Total Protein: 7 g/dL (ref 6.0–8.5)
eGFR: 113 mL/min/{1.73_m2} (ref >59)

## 2021-01-03 LAB — LIPID PANEL W/O CHOL/HDL RATIO
Cholesterol, Total: 245 mg/dL — ABNORMAL HIGH (ref 100–199)
HDL: 50 mg/dL (ref >39)
LDL Chol Calc (NIH): 157 mg/dL — ABNORMAL HIGH (ref 0–99)
Triglycerides: 210 mg/dL — ABNORMAL HIGH (ref 0–149)
VLDL Cholesterol Cal: 38 mg/dL (ref 5–40)

## 2021-01-06 ENCOUNTER — Encounter: Payer: Self-pay | Admitting: Internal Medicine

## 2021-02-27 ENCOUNTER — Ambulatory Visit
Admission: RE | Admit: 2021-02-27 | Discharge: 2021-02-27 | Disposition: A | Discharge status: Home / Self Care | Payer: No Typology Code available for payment source | Source: Ambulatory Visit | Attending: Internal Medicine | Admitting: Internal Medicine

## 2021-02-27 ENCOUNTER — Other Ambulatory Visit: Payer: Self-pay

## 2021-02-27 DIAGNOSIS — Z1231 Encounter for screening mammogram for malignant neoplasm of breast: Secondary | ICD-10-CM

## 2021-06-24 ENCOUNTER — Other Ambulatory Visit: Payer: Self-pay

## 2021-07-01 ENCOUNTER — Other Ambulatory Visit: Payer: Self-pay

## 2021-07-03 ENCOUNTER — Other Ambulatory Visit: Payer: Self-pay

## 2021-07-03 DIAGNOSIS — E782 Mixed hyperlipidemia: Secondary | ICD-10-CM

## 2021-07-04 LAB — LIPID PANEL W/O CHOL/HDL RATIO
Cholesterol, Total: 244 mg/dL — ABNORMAL HIGH (ref 100–199)
HDL: 46 mg/dL
LDL Chol Calc (NIH): 159 mg/dL — ABNORMAL HIGH (ref 0–99)
Triglycerides: 211 mg/dL — ABNORMAL HIGH (ref 0–149)
VLDL Cholesterol Cal: 39 mg/dL (ref 5–40)

## 2021-09-22 DIAGNOSIS — E041 Nontoxic single thyroid nodule: Secondary | ICD-10-CM | POA: Insufficient documentation

## 2021-09-22 DIAGNOSIS — E782 Mixed hyperlipidemia: Secondary | ICD-10-CM | POA: Insufficient documentation

## 2021-09-22 DIAGNOSIS — N920 Excessive and frequent menstruation with regular cycle: Secondary | ICD-10-CM | POA: Insufficient documentation

## 2021-12-07 IMAGING — MG MM DIGITAL SCREENING BILAT W/ TOMO AND CAD
8 series · 9 of 24 positions shown · non-contrast
Comparison: Previous exam(s).

CLINICAL DATA: Screening.

EXAM:
DIGITAL SCREENING BILATERAL MAMMOGRAM WITH TOMOSYNTHESIS AND CAD
TECHNIQUE: Bilateral screening digital craniocaudal and mediolateral oblique
mammograms were obtained. Bilateral screening digital breast
tomosynthesis was performed. The images were evaluated with
computer-aided detection.

[R CC synth-2D]
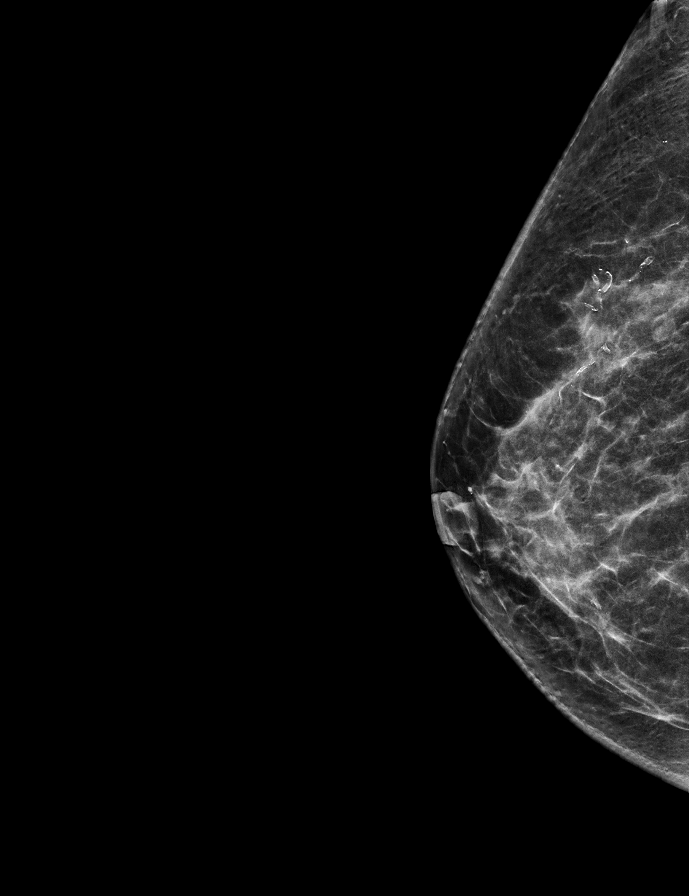

[R MLO synth-2D]
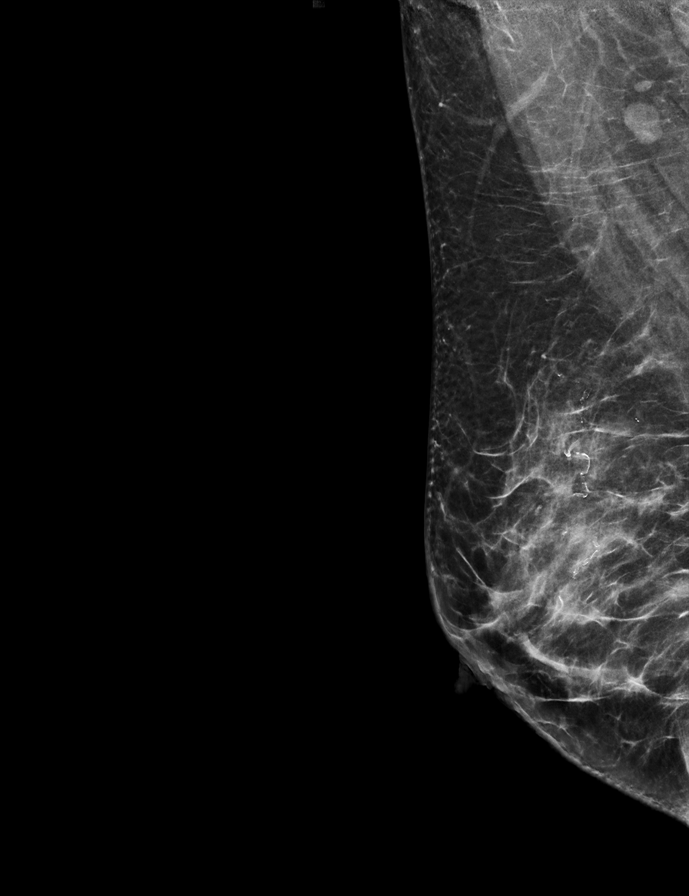

[L CC synth-2D]
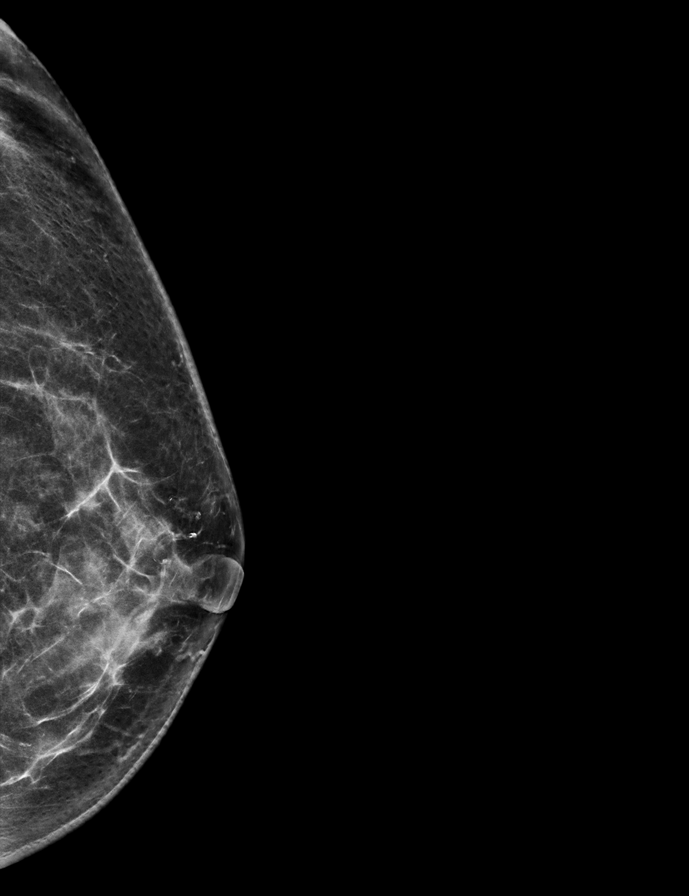

[L MLO synth-2D]
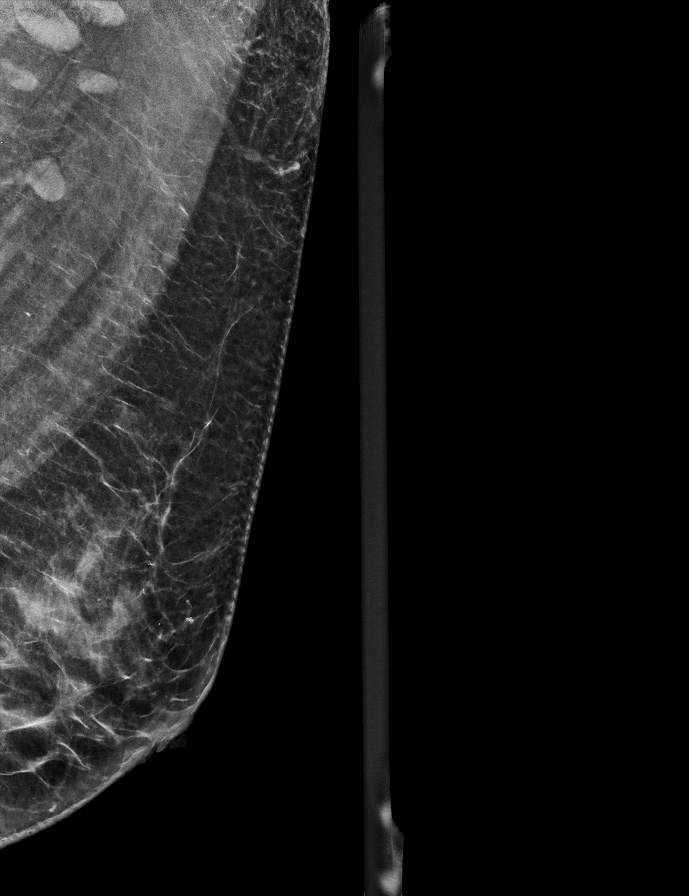

[R CC tomo · 2 of 64 frames shown]
[frame 21/64]
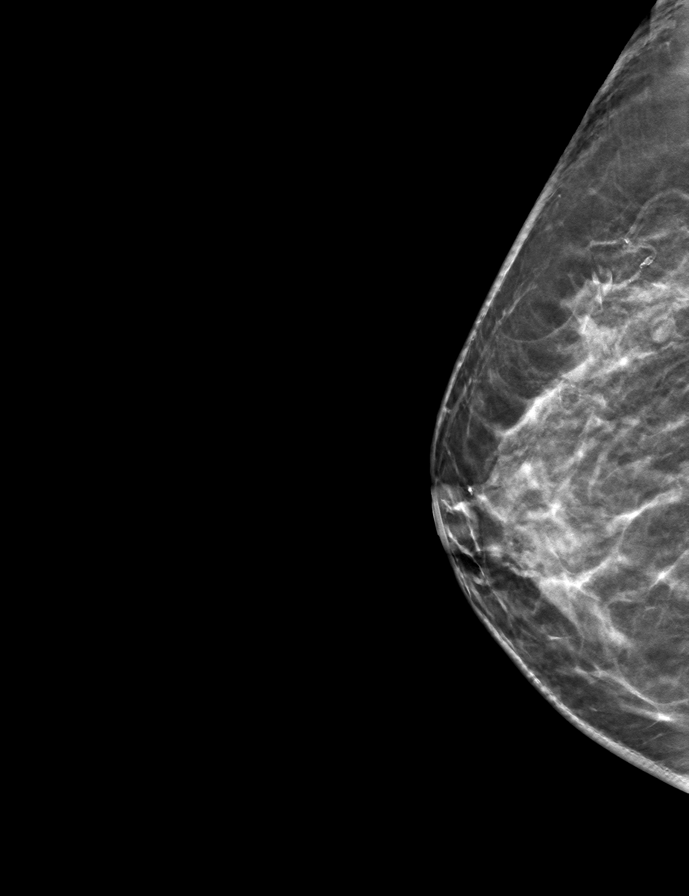
[frame 33/64]
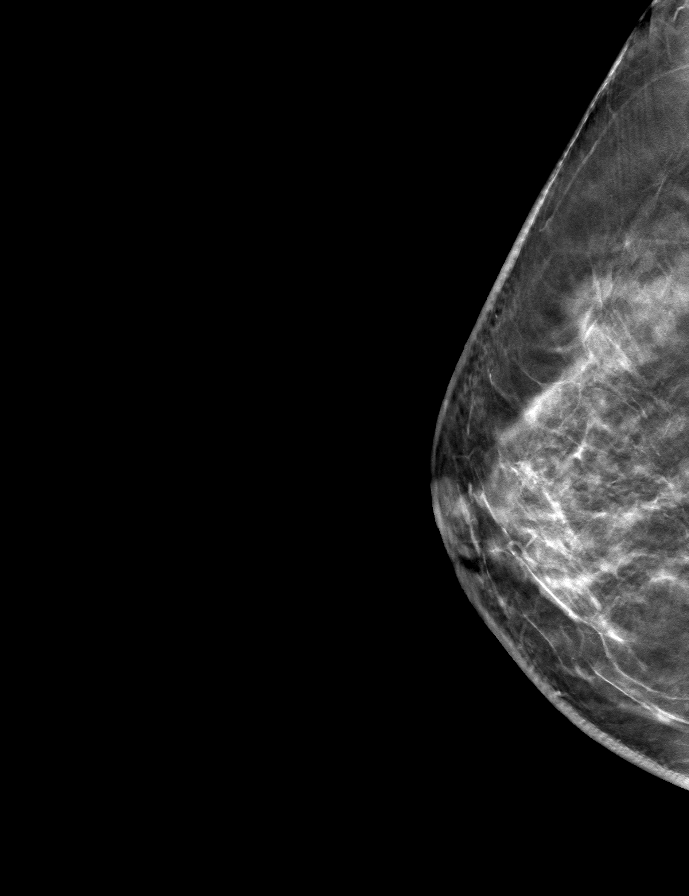

[L MLO tomo · tomo slice 35/68.0]
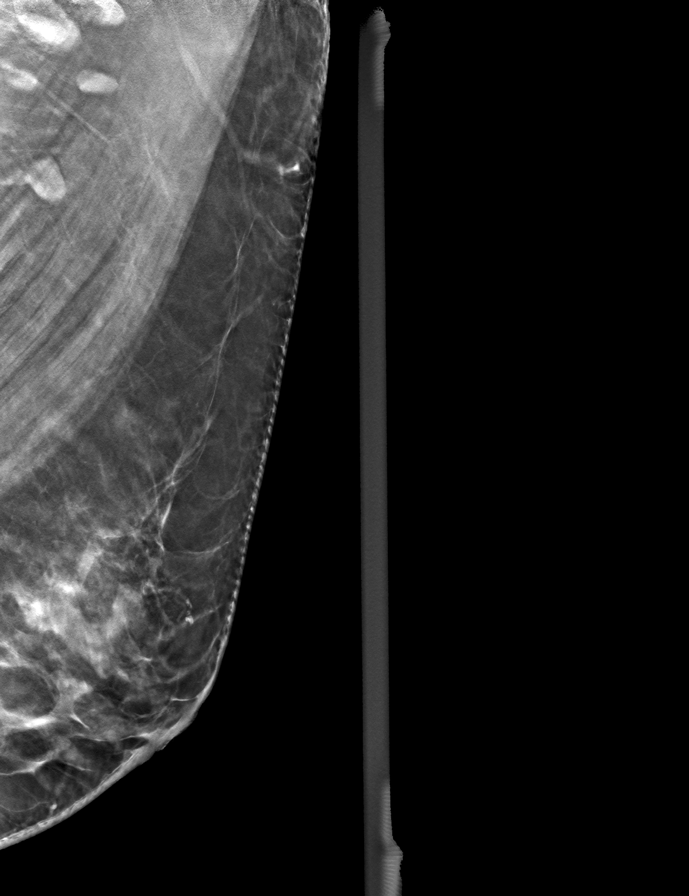

[R MLO tomo · tomo slice 33/66.0]
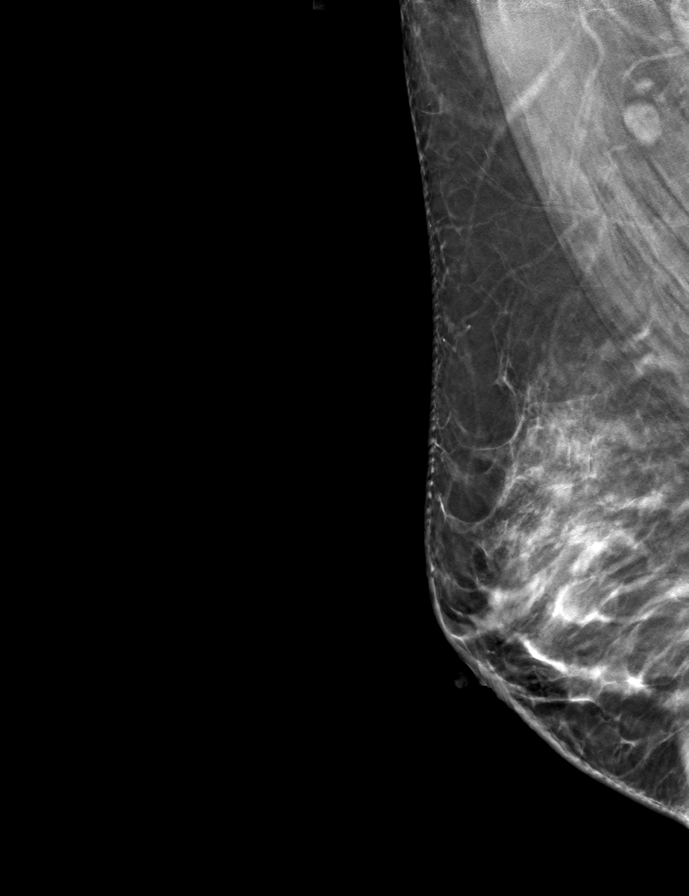

[L CC tomo · tomo slice 35/68.0]
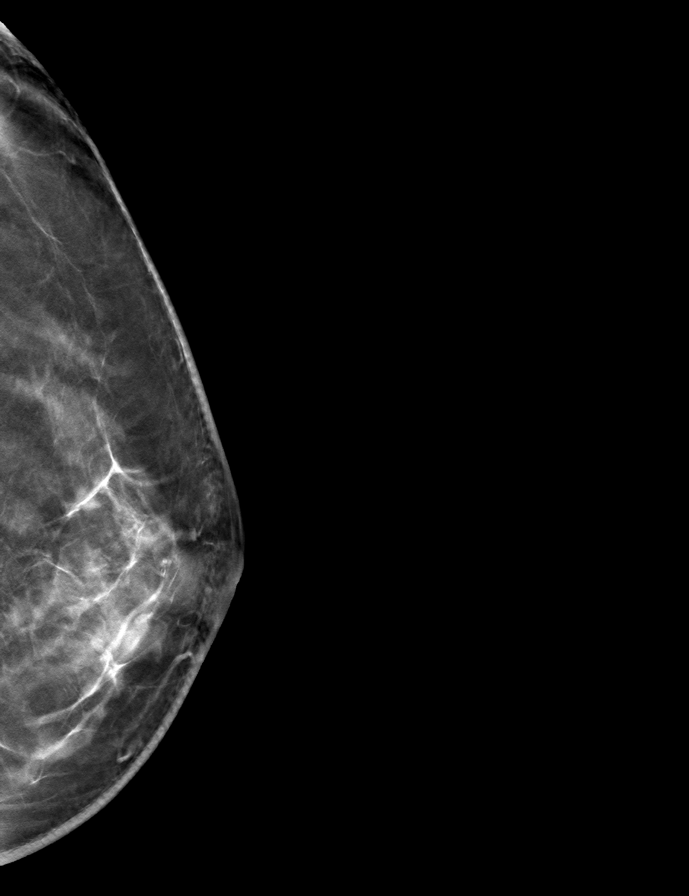

[9 of 24 positions shown; findings below may reference images not displayed]

ACR Breast Density Category c: The breast tissue is heterogeneously
dense, which may obscure small masses.
FINDINGS: There are no findings suspicious for malignancy.
IMPRESSION: No mammographic evidence of malignancy. A result letter of this
screening mammogram will be mailed directly to the patient.

RECOMMENDATION:
Screening mammogram in one year. (Code:Q3-W-BC3)

BI-RADS CATEGORY  1: Negative.

## 2022-01-02 ENCOUNTER — Encounter: Payer: Self-pay | Admitting: Internal Medicine

## 2022-06-08 ENCOUNTER — Other Ambulatory Visit: Payer: Self-pay | Admitting: Obstetrics and Gynecology

## 2022-06-08 DIAGNOSIS — Z1231 Encounter for screening mammogram for malignant neoplasm of breast: Secondary | ICD-10-CM

## 2022-07-23 ENCOUNTER — Ambulatory Visit
Admission: RE | Admit: 2022-07-23 | Discharge: 2022-07-23 | Disposition: A | Discharge status: Home / Self Care | Payer: No Typology Code available for payment source | Source: Ambulatory Visit | Attending: Obstetrics and Gynecology | Admitting: Obstetrics and Gynecology

## 2022-07-23 ENCOUNTER — Ambulatory Visit: Payer: Self-pay | Admitting: Hematology and Oncology

## 2022-07-23 VITALS — BP 116/74 | Wt 147.0 lb

## 2022-07-23 DIAGNOSIS — Z124 Encounter for screening for malignant neoplasm of cervix: Secondary | ICD-10-CM

## 2022-07-23 DIAGNOSIS — Z1231 Encounter for screening mammogram for malignant neoplasm of breast: Secondary | ICD-10-CM

## 2022-07-23 NOTE — Patient Instructions (Signed)
Taught Kayda Allers Leaf about BSE and gave educational materials to take home. Patient did not need a Pap smear today due to last Pap smear was in 12/2019 per patient. She will return to clinic in June this year for screening Pap. Let her know [facility] will cover Pap smears every 5 years unless has a history of abnormal Pap smears. Referred patient to the [facility] for diagnostic mammogram. Appointment scheduled for 07/23/22. Patient aware of appointment and will be there. Let patient know will follow up with her within the next couple weeks with results. Reia Donald Prose Levengood verbalized understanding.  Melodye Ped, NP 10:27 AM

## 2022-07-23 NOTE — Progress Notes (Signed)
Christine Tapia is a 47 y.o. female who presents to [facility] clinic today with no complaints.    Pap Smear: Pap not smear completed today. Last Pap smear was 12/2022 and was normal. She will be due in June 2024 for repeat and will come in for a screening Pap day. Per patient has history of an abnormal Pap smear. Last Pap smear result is available in Epic. 2016 High grade with biopsy and LEEP. Normal since then.    Physical exam: Breasts Breasts symmetrical. No skin abnormalities bilateral breasts. No nipple retraction bilateral breasts. No nipple discharge bilateral breasts. No lymphadenopathy. No lumps palpated bilateral breasts.  MS DIGITAL SCREENING TOMO BILATERAL  Result Date: 03/05/2021 CLINICAL DATA:  Screening. EXAM: DIGITAL SCREENING BILATERAL MAMMOGRAM WITH TOMOSYNTHESIS AND CAD TECHNIQUE: Bilateral screening digital craniocaudal and mediolateral oblique mammograms were obtained. Bilateral screening digital breast tomosynthesis was performed. The images were evaluated with computer-aided detection. COMPARISON:  Previous exam(s). ACR Breast Density Category c: The breast tissue is heterogeneously dense, which may obscure small masses. FINDINGS: There are no findings suspicious for malignancy. IMPRESSION: No mammographic evidence of malignancy. A result letter of this screening mammogram will be mailed directly to the patient. RECOMMENDATION: Screening mammogram in one year. (Code:SM-B-01Y) BI-RADS CATEGORY  1: Negative. Electronically Signed   By: Ileana Roup M.D.   On: 03/05/2021 16:27   MS DIGITAL SCREENING TOMO BILATERAL  Result Date: 11/10/2019 CLINICAL DATA:  Screening. EXAM: DIGITAL SCREENING BILATERAL MAMMOGRAM WITH TOMO AND CAD COMPARISON:  Previous exam(s). ACR Breast Density Category c: The breast tissue is heterogeneously dense, which may obscure small masses. FINDINGS: There are no findings suspicious for malignancy. Images were processed with CAD. IMPRESSION: No mammographic  evidence of malignancy. A result letter of this screening mammogram will be mailed directly to the patient. RECOMMENDATION: Screening mammogram in one year. (Code:SM-B-01Y) BI-RADS CATEGORY  1: Negative. Electronically Signed   By: Nolon Nations M.D.   On: 11/10/2019 07:52         Pelvic/Bimanual Pap is not indicated today.    Smoking History: Patient has never smoked and was not referred to quit line.    Patient Navigation: Patient education provided. Access to services provided for patient through [place] interpreter provided. No transportation provided   Colorectal Cancer Screening: Per patient has had colonoscopy completed on 10/01/20 with tubular adenoma. She will follow up in 3 years.  No complaints today.    Breast and Cervical Cancer Risk Assessment: Patient does not have family history of breast cancer, known genetic mutations, or radiation treatment to the chest before age 27. Patient has history of cervical dysplasia, immunocompromised, or DES exposure in-utero.  Risk Scores as of 07/23/2022     [name]           5-year 0.65 %   Lifetime 6.72 %   This patient is [place]/Lat[place] but has no documented birth country, so the [facility] used data from [place] patients to calculate their risk score. Document a birth country in the Demographics activity for a more accurate score.         Last calculated by Claretha Cooper, CMA on 07/23/2022 at 10:11 AM        A: [facility] exam without pap smear No complaints with benign exam.   P: Referred patient to the [facility] for a screening mammogram. Appointment scheduled 07/23/22.  Dayton Scrape A, NP 07/23/2022 10:22 AM

## 2023-01-04 DIAGNOSIS — R87810 Cervical high risk human papillomavirus (HPV) DNA test positive: Secondary | ICD-10-CM | POA: Insufficient documentation

## 2023-01-04 HISTORY — DX: Cervical high risk human papillomavirus (HPV) DNA test positive: R87.810

## 2023-02-02 ENCOUNTER — Other Ambulatory Visit: Payer: Self-pay | Admitting: Hematology and Oncology

## 2023-02-02 VITALS — Wt 145.8 lb

## 2023-02-02 DIAGNOSIS — Z124 Encounter for screening for malignant neoplasm of cervix: Secondary | ICD-10-CM
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
## 2023-02-09 NOTE — Progress Notes (Signed)
Repeat in one year.

## 2023-04-17 ENCOUNTER — Encounter: Payer: Self-pay | Admitting: Internal Medicine

## 2023-04-19 ENCOUNTER — Emergency Department (HOSPITAL_COMMUNITY): Payer: No Typology Code available for payment source

## 2023-04-19 ENCOUNTER — Emergency Department (HOSPITAL_COMMUNITY)
Admission: EM | Admit: 2023-04-19 | Discharge: 2023-04-19 | Disposition: A | Discharge status: Home / Self Care | Payer: No Typology Code available for payment source | Attending: Emergency Medicine | Admitting: Emergency Medicine

## 2023-04-19 ENCOUNTER — Encounter (HOSPITAL_COMMUNITY): Payer: Self-pay

## 2023-04-19 ENCOUNTER — Other Ambulatory Visit: Payer: Self-pay

## 2023-04-19 DIAGNOSIS — M25561 Pain in right knee: Secondary | ICD-10-CM | POA: Insufficient documentation

## 2023-04-19 DIAGNOSIS — M25511 Pain in right shoulder: Secondary | ICD-10-CM | POA: Diagnosis not present

## 2023-04-19 MED ORDER — NAPROXEN 500 MG PO TABS: 500.0000 mg | ORAL_TABLET | Freq: Two times a day (BID) | ORAL | 0 refills | Status: DC
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
## 2023-04-19 NOTE — Discharge Instructions (Addendum)
Las imgenes que obtuvimos hoy no mostraron ninguna anomala significativa.  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  Te he empezado a Psychologist, forensic.  Tomar segn lo prescrito  Tambin puede tomar Tylenol 100 mg 4 veces al da.  Haga un seguimiento con el proveedor de Marine scientist. Regrese si los sntomas son nuevos o Psychologist, prison and probation services

## 2023-06-22 ENCOUNTER — Telehealth: Payer: Self-pay

## 2023-06-22 NOTE — Telephone Encounter (Signed)
Telephoned patient at mobile number using language line interpreter#[phone]. Left a voice message with [facility] contact information.

## 2023-06-24 ENCOUNTER — Other Ambulatory Visit: Payer: Self-pay | Admitting: Obstetrics and Gynecology

## 2023-06-24 DIAGNOSIS — Z1231 Encounter for screening mammogram for malignant neoplasm of breast: Secondary | ICD-10-CM

## 2023-08-24 ENCOUNTER — Ambulatory Visit: Admission: RE | Admit: 2023-08-24 | Payer: No Typology Code available for payment source | Source: Ambulatory Visit

## 2023-08-24 ENCOUNTER — Ambulatory Visit: Payer: Self-pay | Admitting: *Deleted

## 2023-08-24 VITALS — BP 115/81 | Wt 145.0 lb

## 2023-08-24 DIAGNOSIS — N644 Mastodynia: Secondary | ICD-10-CM

## 2023-08-24 DIAGNOSIS — Z1239 Encounter for other screening for malignant neoplasm of breast: Secondary | ICD-10-CM

## 2023-08-24 NOTE — Patient Instructions (Signed)
Explained breast self awareness with Christine Tapia. Patient did not need a Pap smear today due to last Pap smear and HPV typing was 02/02/2023. Let her know that due to her last Pap smear was HPV positive that her next Pap smear is due in July 2025. [facility]erred [facility] for a diagnostic mammogram. Appointment scheduled Thursday, September 02, 2023 at 1240. Patient aware of appointment and will be there. Christine Tapia verbalized understanding.  Tyrell Brereton, Kathaleen Maser, RN 2:09 PM

## 2023-08-24 NOTE — Progress Notes (Signed)
Ms. Christine Tapia is a 48 y.o. female who presents to [facility] clinic today with complaint of right upper outer quadrant pain x 2 weeks that is constant. Patient rates the pain at a 5 out of 10.    Pap Smear: Pap smear not completed today. Last Pap smear was 02/02/2023 at [facility] clinic and was normal with Positive HPV. Per patient has history of an abnormal Pap smear 8//2016 that was ASCUS with positive HPV. 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 Last Pap smear, colposcopy, and LEEP results are in EPIC.   Physical exam: Breasts Breasts symmetrical. No skin abnormalities bilateral breasts. No nipple retraction bilateral breasts. No nipple discharge bilateral breasts. No lymphadenopathy. No lumps palpated bilateral breasts. Complaints of left breast tenderness around nipple area on exam. Complaints of right upper outer breast pain on exam.     MS DIGITAL SCREENING TOMO BILATERAL Result Date: 07/24/2022 CLINICAL DATA:  Screening. EXAM: DIGITAL SCREENING BILATERAL MAMMOGRAM WITH TOMOSYNTHESIS AND CAD TECHNIQUE: Bilateral screening digital craniocaudal and mediolateral oblique mammograms were obtained. Bilateral screening digital breast tomosynthesis was performed. The images were evaluated with computer-aided detection. COMPARISON:  Previous exam(s). ACR Breast Density Category c: The breast tissue is heterogeneously dense, which may obscure small masses. FINDINGS: There are no findings suspicious for malignancy. IMPRESSION: No mammographic evidence of malignancy. A result letter of this screening mammogram will be mailed directly to the patient. RECOMMENDATION: Screening mammogram in one year. (Code:SM-B-01Y) BI-RADS CATEGORY  1: Negative. Electronically Signed   By: Harmon Pier M.D.   On: 07/24/2022 13:55   MS DIGITAL SCREENING TOMO BILATERAL Result Date: 03/05/2021 CLINICAL  DATA:  Screening. EXAM: DIGITAL SCREENING BILATERAL MAMMOGRAM WITH TOMOSYNTHESIS AND CAD TECHNIQUE: Bilateral screening digital craniocaudal and mediolateral oblique mammograms were obtained. Bilateral screening digital breast tomosynthesis was performed. The images were evaluated with computer-aided detection. COMPARISON:  Previous exam(s). ACR Breast Density Category c: The breast tissue is heterogeneously dense, which may obscure small masses. FINDINGS: There are no findings suspicious for malignancy. IMPRESSION: No mammographic evidence of malignancy. A result letter of this screening mammogram will be mailed directly to the patient. RECOMMENDATION: Screening mammogram in one year. (Code:SM-B-01Y) BI-RADS CATEGORY  1: Negative. Electronically Signed   By: Sherron Ales M.D.   On: 03/05/2021 16:27   MS DIGITAL SCREENING TOMO BILATERAL Result Date: 11/10/2019 CLINICAL DATA:  Screening. EXAM: DIGITAL SCREENING BILATERAL MAMMOGRAM WITH TOMO AND CAD COMPARISON:  Previous exam(s). ACR Breast Density Category c: The breast tissue is heterogeneously dense, which may obscure small masses. FINDINGS: There are no findings suspicious for malignancy. Images were processed with CAD. IMPRESSION: No mammographic evidence of malignancy. A result letter of this screening mammogram will be mailed directly to the patient. RECOMMENDATION: Screening mammogram in one year. (Code:SM-B-01Y) BI-RADS CATEGORY  1: Negative. Electronically Signed   By: Norva Pavlov M.D.   On: 11/10/2019 07:52    Pelvic/Bimanual Pap is not indicated today per BCCCP guidelines.   Smoking History: Patient has never smoked.   Patient Navigation: Patient education provided. Access to services provided for patient through [facility] program. Spanish interpreter [name] from [facility] provided.   Colorectal Cancer Screening: Per patient has had colonoscopy completed on 10/01/2020.  No complaints today.    Breast and Cervical Cancer Risk  Assessment: Patient does not have family history of breast cancer, known genetic mutations, or radiation treatment  to the chest before age 26. Patient does not have history of cervical dysplasia, immunocompromised, or DES exposure in-utero.  Risk Scores as of Encounter on 08/24/2023     [name] as of 07/23/2022           5-year 0.65%   Lifetime 6.72%   This patient is [place]/Lat[place] but has no documented birth country, so the [place] model used data from [place] patients to calculate their risk score. Document a birth country in the Demographics activity for a more accurate score.         Last calculated by Caprice Red, CMA on 07/23/2022 at 10:11 AM        A: BCCCP exam without pap smear Complaint of right upper outer quadrant pain.  P: Referred patient to the [facility] for a diagnostic mammogram. Appointment scheduled Thursday, September 02, 2023 at 1240.  Priscille Heidelberg, RN 08/24/2023 2:09 PM

## 2023-09-02 ENCOUNTER — Ambulatory Visit
Admission: RE | Admit: 2023-09-02 | Discharge: 2023-09-02 | Disposition: A | Discharge status: Home / Self Care | Payer: No Typology Code available for payment source | Source: Ambulatory Visit | Attending: Obstetrics and Gynecology | Admitting: Obstetrics and Gynecology

## 2023-09-02 ENCOUNTER — Other Ambulatory Visit: Payer: Self-pay | Admitting: Obstetrics and Gynecology

## 2023-09-02 DIAGNOSIS — N644 Mastodynia: Secondary | ICD-10-CM

## 2023-09-02 DIAGNOSIS — Z1239 Encounter for other screening for malignant neoplasm of breast: Secondary | ICD-10-CM

## 2023-12-12 ENCOUNTER — Encounter (HOSPITAL_COMMUNITY): Payer: Self-pay | Admitting: Emergency Medicine

## 2023-12-12 ENCOUNTER — Ambulatory Visit (HOSPITAL_COMMUNITY)
Admission: EM | Admit: 2023-12-12 | Discharge: 2023-12-12 | Disposition: A | Discharge status: Home / Self Care | Payer: Self-pay | Attending: Emergency Medicine | Admitting: Emergency Medicine

## 2023-12-12 DIAGNOSIS — M25562 Pain in left knee: Secondary | ICD-10-CM

## 2023-12-12 DIAGNOSIS — M25561 Pain in right knee: Secondary | ICD-10-CM

## 2023-12-12 DIAGNOSIS — M25521 Pain in right elbow: Secondary | ICD-10-CM

## 2023-12-12 DIAGNOSIS — M25522 Pain in left elbow: Secondary | ICD-10-CM

## 2023-12-12 DIAGNOSIS — N926 Irregular menstruation, unspecified: Secondary | ICD-10-CM

## 2023-12-12 MED ORDER — DICLOFENAC SODIUM 1 % EX GEL: 2.0000 g | Freq: Four times a day (QID) | CUTANEOUS | 0 refills | Status: AC
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
## 2023-12-12 NOTE — Discharge Instructions (Addendum)
 Voltaren gel rubbed into the knees and elbows four times daily Please call primary care provider to follow up about joint pain Please also call the women's clinic to make an appointment for follow up about menstrual cycles

## 2023-12-12 NOTE — ED Triage Notes (Addendum)
 Pt's son interpreted.  Pt had bilateral knee and elbow pain for years.. tried natural pills. Denies any injury.  Pt also adds that her menstrual cycles are a month long. Reports that she just came off her menstrual cycle 3 days ago that lasted for a mnth.

## 2024-02-02 ENCOUNTER — Other Ambulatory Visit: Payer: Self-pay | Admitting: *Deleted

## 2024-02-02 ENCOUNTER — Other Ambulatory Visit: Payer: Self-pay

## 2024-02-02 DIAGNOSIS — Z124 Encounter for screening for malignant neoplasm of cervix: Secondary | ICD-10-CM
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
## 2024-02-02 NOTE — Patient Instructions (Signed)
 SABRA

## 2024-02-10 LAB — CYTOLOGY - PAP
Comment: NEGATIVE
Comment: NEGATIVE
Comment: NEGATIVE
Diagnosis: NEGATIVE
HPV 16: NEGATIVE
HPV 18 / 45: NEGATIVE
High risk HPV: POSITIVE — AB

## 2024-02-14 ENCOUNTER — Ambulatory Visit: Payer: Self-pay

## 2024-02-14 NOTE — Progress Notes (Signed)
 Hey Dr Alger-  Give you confirm that Christine Tapia needs a colpo due to having +HPV again this year? Last year was negative, +HPV. She does have a hx of a colpo and LEEP in 2016.

## 2024-02-15 NOTE — Progress Notes (Signed)
 We will always need you :)

## 2024-02-16 NOTE — Telephone Encounter (Signed)
 Left message for patient via [name], CHERRI about returning our call to go over lab results. Left name and number for patient to call back.

## 2024-02-17 NOTE — Telephone Encounter (Signed)
 Called patient to give pap smear results. Informed patient that pap smear was normal and HPV was positive. Based on this result and her previous pap smear the recommendation is for a colpo. Informed patient that we will be referring her to the [facility] to have the colpo done. Let patient know that her appointment will probably not be until October due to their schedule. Instructed patient to call the # on her pink card if she has not heard from anyone within the next 2 weeks. Patient voiced understanding.

## 2024-04-11 ENCOUNTER — Other Ambulatory Visit: Payer: Self-pay

## 2024-04-11 ENCOUNTER — Other Ambulatory Visit (HOSPITAL_COMMUNITY)
Admission: RE | Admit: 2024-04-11 | Discharge: 2024-04-11 | Disposition: A | Discharge status: Home / Self Care | Payer: Self-pay | Source: Ambulatory Visit | Attending: Obstetrics and Gynecology | Admitting: Obstetrics and Gynecology

## 2024-04-11 ENCOUNTER — Ambulatory Visit (INDEPENDENT_AMBULATORY_CARE_PROVIDER_SITE_OTHER): Payer: Self-pay | Admitting: Obstetrics and Gynecology

## 2024-04-11 ENCOUNTER — Inpatient Hospital Stay (HOSPITAL_COMMUNITY): Admit: 2024-04-11

## 2024-04-11 VITALS — BP 112/69 | HR 62 | Wt 150.1 lb

## 2024-04-11 DIAGNOSIS — B977 Papillomavirus as the cause of diseases classified elsewhere: Secondary | ICD-10-CM | POA: Insufficient documentation

## 2024-04-11 DIAGNOSIS — Z9889 Other specified postprocedural states: Secondary | ICD-10-CM

## 2024-04-11 MED ORDER — IBUPROFEN 800 MG PO TABS
800.0000 mg | ORAL_TABLET | Freq: Once | ORAL | Status: AC
Start: 2024-04-11 — End: 2024-04-11
  Administered 2024-04-11: 800 mg via ORAL

## 2024-04-11 NOTE — Progress Notes (Signed)
 Colposcopy Procedure Note  Christine Tapia is a 48 y.o. [id number] here for colposcopy.  Consent and interview done via Engineer, structural.  Indications:  Pap 02/02/2023: negative with positive hrHPV (not 16/18/45) Pap 02/02/2024: negative with positive hrHPV (not 16/18/45) Pap 12/28/2019: negative, HPV not tested Pap 04/2016: negative, negative HPV LEEP 2016: CIN2 negative margins  Procedure Details  S/p BTL    The risks (including infection, bleeding, pain) and benefits of the procedure were explained to the patient and written informed consent was obtained.  The patient was placed in the dorsal lithotomy position. A Graves was speculum inserted in the vagina, and the cervix was visualized.  The cervix was stained with acetic acid and visualized using the colposcope under magnification as well as with a green filter. Findings as below. Cervical biopsies were taken at 9, 12, 3 o'clock. Endocervical curettage then performed in all four quadrants. Small amount of bleeding noted that improved with pressure. Monsel's solution applied with good hemostasis noted. Patient tolerating procedure well.  Findings: small amount acetowhite changes at 3   Impression: low grade  Adequate: no, unable to visualize   Specimens:  Cervical biopsy at 9, 12, 3 o'clock Endocervical curettage  Condition: Stable  Complications: None  Plan: The patient was advised to call for any fever or for prolonged or severe pain or bleeding. She was advised to use OTC analgesics as needed for mild to moderate pain. She was advised to avoid vaginal intercourse for 48 hours or until the bleeding has completely stopped.  Will base further management on results of biopsy.   [name], MD, [facility] Attending Center for [employer] [facility])

## 2024-04-13 LAB — SURGICAL PATHOLOGY

## 2024-04-14 ENCOUNTER — Ambulatory Visit: Payer: Self-pay | Admitting: Obstetrics & Gynecology

## 2024-04-14 NOTE — Progress Notes (Signed)
 LSIL, repeat pap in 12 months

## 2024-04-17 NOTE — Telephone Encounter (Signed)
 RN called pt using [employer] [phone].  Advised pt of Colposcopy result and provider's recommendation of Pap smear in one year.  Pt verbalized understanding and had no further questions at this time.    [place], RN

## 2024-04-17 NOTE — Telephone Encounter (Addendum)
 RN attempted to call pt using [employer] [phone].  Left voicemail with office call back number.   Waddell, RN     ----- Message from Lynwood Solomons sent at 04/14/2024 10:16 AM EDT ----- LSIL, repeat pap in 12 months ----- Message ----- From: Interface, Lab In [facility] Sent: 04/13/2024  11:56 AM EDT To: Lynwood KANDICE Solomons, MD

## 2024-06-26 ENCOUNTER — Encounter (HOSPITAL_COMMUNITY): Payer: Self-pay

## 2024-06-26 ENCOUNTER — Other Ambulatory Visit: Payer: Self-pay

## 2024-06-26 ENCOUNTER — Emergency Department (HOSPITAL_COMMUNITY)

## 2024-06-26 ENCOUNTER — Emergency Department (HOSPITAL_COMMUNITY): Admission: EM | Admit: 2024-06-26 | Discharge: 2024-06-26 | Disposition: A | Discharge status: Home / Self Care

## 2024-06-26 DIAGNOSIS — R059 Cough, unspecified: Secondary | ICD-10-CM | POA: Insufficient documentation

## 2024-06-26 DIAGNOSIS — J069 Acute upper respiratory infection, unspecified: Secondary | ICD-10-CM | POA: Insufficient documentation

## 2024-06-26 LAB — I-STAT CG4 LACTIC ACID, ED: Lactic Acid, Venous: 1.6 mmol/L (ref 0.5–1.9)

## 2024-06-26 LAB — COMPREHENSIVE METABOLIC PANEL WITH GFR
ALT: 21 U/L (ref 0–44)
AST: 24 U/L (ref 15–41)
Albumin: 4.9 g/dL (ref 3.5–5.0)
Alkaline Phosphatase: 93 U/L (ref 38–126)
Anion gap: 13 (ref 5–15)
BUN: 13 mg/dL (ref 6–20)
CO2: 22 mmol/L (ref 22–32)
Calcium: 9.3 mg/dL (ref 8.9–10.3)
Chloride: 102 mmol/L (ref 98–111)
Creatinine, Ser: 0.58 mg/dL (ref 0.44–1.00)
GFR, Estimated: >60 mL/min
Glucose, Bld: 92 mg/dL (ref 70–99)
Potassium: 3.8 mmol/L (ref 3.5–5.1)
Sodium: 136 mmol/L (ref 135–145)
Total Bilirubin: 0.4 mg/dL (ref 0.0–1.2)
Total Protein: 8.2 g/dL — ABNORMAL HIGH (ref 6.5–8.1)

## 2024-06-26 LAB — CBC WITH DIFFERENTIAL/PLATELET
Abs Immature Granulocytes: 0.03 K/uL (ref 0.00–0.07)
Basophils Absolute: 0 K/uL (ref 0.0–0.1)
Basophils Relative: 1 %
Eosinophils Absolute: 0.1 K/uL (ref 0.0–0.5)
Eosinophils Relative: 1 %
HCT: 34.9 % — ABNORMAL LOW (ref 36.0–46.0)
Hemoglobin: 11 g/dL — ABNORMAL LOW (ref 12.0–15.0)
Immature Granulocytes: 0 %
Lymphocytes Relative: 10 %
Lymphs Abs: 0.9 K/uL (ref 0.7–4.0)
MCH: 24.5 pg — ABNORMAL LOW (ref 26.0–34.0)
MCHC: 31.5 g/dL (ref 30.0–36.0)
MCV: 77.7 fL — ABNORMAL LOW (ref 80.0–100.0)
Monocytes Absolute: 0.4 K/uL (ref 0.1–1.0)
Monocytes Relative: 5 %
Neutro Abs: 7.3 K/uL (ref 1.7–7.7)
Neutrophils Relative %: 83 %
Platelets: 512 K/uL — ABNORMAL HIGH (ref 150–400)
RBC: 4.49 MIL/uL (ref 3.87–5.11)
RDW: 17.8 % — ABNORMAL HIGH (ref 11.5–15.5)
WBC: 8.8 K/uL (ref 4.0–10.5)
nRBC: 0 % (ref 0.0–0.2)

## 2024-06-26 LAB — GROUP A STREP BY PCR: Group A Strep by PCR: NOT DETECTED

## 2024-06-26 MED ORDER — ACETAMINOPHEN 325 MG PO TABS: 650.0000 mg | ORAL_TABLET | Freq: Once | ORAL | Status: DC

## 2024-06-26 MED ORDER — SODIUM CHLORIDE 0.9 % IV BOLUS
1000.0000 mL | Freq: Once | INTRAVENOUS | Status: AC
  Administered 2024-06-26: 1000 mL via INTRAVENOUS

## 2024-06-26 MED ORDER — KETOROLAC TROMETHAMINE 15 MG/ML IJ SOLN
15.0000 mg | Freq: Once | INTRAMUSCULAR | Status: AC
  Administered 2024-06-26: 15 mg via INTRAVENOUS

## 2024-06-26 MED ORDER — KETOROLAC TROMETHAMINE 15 MG/ML IJ SOLN
15.0000 mg | Freq: Once | INTRAMUSCULAR | Status: DC
  Filled 2024-06-26: qty 1

## 2024-06-26 MED ORDER — ACETAMINOPHEN 500 MG PO TABS
1000.0000 mg | ORAL_TABLET | Freq: Once | ORAL | Status: AC
  Administered 2024-06-26: 1000 mg via ORAL
  Filled 2024-06-26: qty 2

## 2024-06-26 NOTE — Discharge Instructions (Signed)
 Continue to hydrate and rest.  If you start to develop any shortness of breath or chest pain please return to the ER.  Follow-up with your primary care if the symptoms continue.

## 2024-06-26 NOTE — ED Provider Notes (Signed)
 " Physical Exam  BP (!) 133/90   Pulse (!) 120   Temp 98.5 F (36.9 C)   Resp (!) 26   Ht 5' 1 (1.549 m)   Wt 63.5 kg   LMP  (LMP Unknown)   SpO2 100%   BMI 26.45 kg/m   Physical Exam Vitals and nursing note reviewed.  Constitutional:      General: She is not in acute distress.    Appearance: Normal appearance. She is well-developed. She is not ill-appearing, toxic-appearing or diaphoretic.  HENT:     Head: Normocephalic and atraumatic.     Nose: Congestion and rhinorrhea present.     Mouth/Throat:     Mouth: Mucous membranes are moist.     Pharynx: Posterior oropharyngeal erythema present.  Eyes:     General: No scleral icterus.    Extraocular Movements: Extraocular movements intact.     Conjunctiva/sclera: Conjunctivae normal.  Cardiovascular:     Rate and Rhythm: Normal rate and regular rhythm.     Pulses: Normal pulses.     Heart sounds: Normal heart sounds.  Pulmonary:     Effort: Pulmonary effort is normal. No respiratory distress.     Breath sounds: Normal breath sounds. No stridor. No wheezing, rhonchi or rales.  Abdominal:     General: Abdomen is flat.  Musculoskeletal:     Cervical back: Normal range of motion.  Skin:    General: Skin is warm and dry.     Capillary Refill: Capillary refill takes less than 2 seconds.     Coloration: Skin is not jaundiced or pale.  Neurological:     Mental Status: She is alert and oriented to person, place, and time.     Procedures  Procedures  ED Course / MDM    Medical Decision Making Amount and/or Complexity of Data Reviewed Labs: ordered. Radiology: ordered.  Risk OTC drugs. Prescription drug management.   Signout from [employer] PA-C at shift change. Briefly, patient presents for evaluation of sore throat x 3 days and bodyaches and fever x 1 day, as well as cough, congestion, rhinorrhea.   Plan: Strep test pending.   5:15 PM Reassessment performed. Patient appears comfortably seated upright in  treatment chair.  Engineer, structural used.  Patient reports improvement with IV fluids, Toradol , and Tylenol .  Denies shortness of breath, chest pain, or abdominal pain.  Physical exam findings consistent with viral illness.  Labs and imaging personally reviewed and interpreted including: Blood work including lactic acid unremarkable.  Respiratory panel negative.   Reviewed additional pertinent lab work and imaging with patient at bedside including: Strep test negative.   Most current vital signs reviewed and are as follows: BP (!) 133/90   Pulse (!) 120   Temp 98.5 F (36.9 C)   Resp (!) 26   Ht 5' 1 (1.549 m)   Wt 63.5 kg   LMP  (LMP Unknown)   SpO2 100%   BMI 26.45 kg/m     Plan: Stable for discharge with presumed viral URI.   Home treatment: Supportive care at home setting with symptom management using over-the-counter medications.   Return and follow-up instructions: Encouraged return to ED with shortness of breath, chest pain, persistent fevers, or any other concerning findings. Encouraged patient to follow-up with their provider as needed. Patient verbalized understanding and agreed with plan.    This note was produced using [employer] Engineer. While the provider has reviewed and verified all clinical information, transcription errors may remain.  Rosina Almarie LABOR, PA-C 06/26/24 1928    Rogelia Jerilynn RAMAN, MD 06/28/24 0005  "

## 2024-06-26 NOTE — ED Provider Notes (Signed)
 " [place] EMERGENCY DEPARTMENT AT [facility] Provider Note   CSN: [id number] Arrival date & time: 06/26/24  1114     Patient presents with: Sore Throat   Christine Tapia is a 48 y.o. female.    Sore Throat  48 year old female presenting today with sore throat and URI symptoms.  Patient states that this all started about 3 days ago with a sore throat.  However last night she started develop body aches and fever.  Did not take anything for fever or bodyaches.  She does not think she has been around anybody sick recently.  She reports having cough, congestion, rhinorrhea.  Patient denies any chest pain but states that she does have some difficulty breathing.  Denies any abdominal pain, nausea, vomiting, diarrhea, constipation.  Spanish interpreter was used during this encounter.     Prior to Admission medications  Medication Sig Start Date End Date Taking? Authorizing Provider  diclofenac  Sodium (VOLTAREN  ARTHRITIS PAIN) 1 % GEL Apply 2 g topically 4 (four) times daily. Patient not taking: Reported on 04/11/2024 12/12/23   Rising, Asberry, PA-C  Multiple Vitamin (MULTIVITAMIN PO) Take by mouth. Patient not taking: Reported on 04/11/2024    [provider]    Allergies: Penicillins    Review of Systems  All other systems reviewed and are negative.   Updated Vital Signs BP (!) 133/90   Pulse (!) 120   Temp 98.4 F (36.9 C) (Oral)   Resp (!) 26   Ht 5' 1 (1.549 m)   Wt 63.5 kg   LMP  (LMP Unknown)   SpO2 100%   BMI 26.45 kg/m   Physical Exam Vitals and nursing note reviewed.  HENT:     Head: Normocephalic.     Nose: Congestion and rhinorrhea present.     Mouth/Throat:     Pharynx: Oropharynx is clear. Posterior oropharyngeal erythema present. No oropharyngeal exudate.     Tonsils: No tonsillar exudate or tonsillar abscesses.  Cardiovascular:     Rate and Rhythm: Normal rate.     Pulses: Normal pulses.  Pulmonary:     Effort: Pulmonary  effort is normal. No respiratory distress.     Breath sounds: Normal breath sounds. No stridor. No wheezing, rhonchi or rales.  Chest:     Chest wall: No tenderness.  Abdominal:     General: Abdomen is flat. Bowel sounds are normal.     Palpations: Abdomen is soft.  Skin:    General: Skin is warm and dry.  Neurological:     General: No focal deficit present.     Mental Status: She is alert.     (all labs ordered are listed, but only abnormal results are displayed) Labs Reviewed  COMPREHENSIVE METABOLIC PANEL WITH GFR - Abnormal; Notable for the following components:      Result Value   Total Protein 8.2 (*)    All other components within normal limits  CBC WITH DIFFERENTIAL/PLATELET - Abnormal; Notable for the following components:   Hemoglobin 11.0 (*)    HCT 34.9 (*)    MCV 77.7 (*)    MCH 24.5 (*)    RDW 17.8 (*)    Platelets 512 (*)    All other components within normal limits  RESP PANEL BY RT-PCR (RSV, FLU A&B, COVID)  RVPGX2  GROUP A STREP BY PCR  I-STAT CG4 LACTIC ACID, ED    EKG: None  Radiology: DG Chest 2 View Result Date: 06/26/2024 CLINICAL DATA:  Fever and  shortness of breath. EXAM: CHEST - 2 VIEW COMPARISON:  March 21, 2015 FINDINGS: The heart size and mediastinal contours are within normal limits. Low lung volumes are noted with very mildly increased bilateral infrahilar lung markings. No acute infiltrate, pleural effusion or pneumothorax is identified. The visualized skeletal structures are unremarkable. IMPRESSION: Low lung volumes with findings and may represent sequelae associated with mild viral bronchitis or mild reactive airway disease. Electronically Signed   By: Suzen Dials M.D.   On: 06/26/2024 12:37     Procedures   Medications Ordered in the ED  acetaminophen  (TYLENOL ) tablet 1,000 mg (1,000 mg Oral Given 06/26/24 1454)  ketorolac  (TORADOL ) 15 MG/ML injection 15 mg (15 mg Intravenous Given 06/26/24 1459)                                     Medical Decision Making Amount and/or Complexity of Data Reviewed Labs: ordered. Radiology: ordered.   Impression: 48 year old female presenting with fever and sore throat.  Differential diagnose include COVID, flu, RSV, strep, peritonsillar abscess  Additional History: Patient provided history I also reviewed previous charts.  Labs: CMP no signs of liver abnormalities or kidney abnormalities or joint abnormality.  CBC showed slight anemia at 11.0.  Lactic acid was 1.6.  Respiratory panel showed no signs of flu, COVID, RSV.  Group A strep is pending.  Imaging: Chest x-ray showed no signs of pneumonia or pneumothorax.  ED Course/Meds: Patient remained stable while in the ER.  COVID, flu, RSV were negative.  Likely cause of her symptoms is a viral URI.  Patient was given Tylenol  and Toradol  for pain and fever.  No sign of exudate on tonsils however she is still complaining of some pain therefore strep test was ordered.  If test is positive we will order antibiotic.  If negative continue to hydrate and take Tylenol  for fever and pain. At end of shift patient care was transferred to [facility].  She will be finishing disposition.      Final diagnoses:  Viral upper respiratory tract infection    ED Discharge Orders     None          Christine Tapia 06/26/24 1515    Ula Prentice SAUNDERS, MD 06/26/24 1544  "

## 2024-06-26 NOTE — ED Triage Notes (Addendum)
 Sore throat since Wednesday, body aches, chills, headache, cough and losing voice for 2 days. C/o feeling sob Interpretor [id number] Ana used for triage

## 2024-07-01 LAB — RESP PANEL BY RT-PCR (RSV, FLU A&B, COVID)  RVPGX2
Influenza A by PCR: NEGATIVE
Influenza B by PCR: NEGATIVE
Resp Syncytial Virus by PCR: NEGATIVE
SARS Coronavirus 2 by RT PCR: NEGATIVE

## 2024-10-02 ENCOUNTER — Ambulatory Visit: Payer: Self-pay | Admitting: Internal Medicine
